# Patient Record
Sex: Female | Born: 1965 | Race: White | Hispanic: No
Health system: Midwestern US, Community
[De-identification: ages and names within clinical notes are randomized; demographics above are authoritative.]

## PROBLEM LIST (undated history)

## (undated) DIAGNOSIS — M79651 Pain in right thigh: Secondary | ICD-10-CM

---

## 2011-11-07 NOTE — ED Provider Notes (Signed)
MEDICATION ADMINISTRATION SUMMARY              Drug Name: *Phenergan, Dose Ordered: 25 mg, Route: IM, Status: Given,         Time: 00:11 11/08/2011,          Drug Name: *Morphine Sulfate, Dose Ordered: 8 mg, Route: IM, Status:         Given, Time: 00:10 11/08/2011,          Drug Name: *Valium, Dose Ordered: 5 mg, Route: Oral, Status: Given,         Time: 00:05 11/08/2011, *Additional information available in notes,         Detailed record available in Medication Service section.       KNOWN ALLERGIES   NKDA       TRIAGE (Mon Nov 07, 2011 23:31 [facility])   PATIENT: NAME: Michelle Herring, AGE: 45, GENDER: female,         DOB: Sat Dec 24, 1965, TIME OF GREET: Mon Nov 07, 2011 23:25, [place]:         [id number], KG WEIGHT: 88.0, MEDICAL RECORD NUMBER: [id number], ACCOUNT         NUMBER: [id number], PCP: Sedonia Small,. [facility] Nov 07, 2011 23:31         [facility])   ADMISSION: URGENCY: 4, TRANSPORT: Ambulatory, DEPT: Emergency,         BED: Rennis Harding 09. (Mon Nov 07, 2011 23:31 DLC3)   VITAL SIGNS: Pulse 83, Resp 15, Temp 97.9, Pain 7, O2 Sat 100, on         Room air, Time 11/07/2011 23:27. (23:27 DLC3)   COMPLAINT:  Fell Back Injury. (Mon Nov 07, 2011 23:31 DLC3)   PRESENTING COMPLAINT:  Pt state slipping and falling 2 days ago         after getting out of the bath tub, and fell on buttock. no radiating         pain. (Tue Nov 08, 2011 00:50 BMG1)   PAIN: Patient complains of pain, Pain described as aching, On a         scale 0-10 patient rates pain as 8, Right lower back, Pain is         constant. (Tue Nov 08, 2011 00:50 BMG1)   TREATMENT PRIOR TO ARRIVAL: None. (Tue Nov 08, 2011 00:50         BMG1)   TB SCREENING: TB screen negative for this patient. (Tue Nov 08, 2011 00:50 BMG1)   ABUSE SCREENING: Patient denies physical abuse or threats. (Tue         Nov 08, 2011 00:50 BMG1)   FALL RISK: Patient has a low risk of falling. (Tue Nov 08, 2011         00:50 BMG1)   SUICIDAL IDEATION: Suicidal ideation is not present. (Tue Nov 08, 2011 00:50 BMG1)   ADVANCE DIRECTIVES: Patient does not have advance directives.         (Tue Nov 08, 2011 00:50 BMG1)   PROVIDERS: TRIAGE NURSE: Durene Fruits, RN,BSN. Sheral Flow Nov 07, 2011 23:31 [facility])       PRESENTING PROBLEM Sheral Flow Nov 07, 2011 23:31 [facility])      Presenting problems: Back Injury-Pain-Swelling.       CURRENT MEDICATIONS   Diovan HCT:  150/25 mg Oral once a day. (23:32 DLC3)   Humalog:  insulin pump. (23:33 DLC3)   Nuvigil (23:33 DLC3)       MEDICATION SERVICE   Morphine Sulfate:  Order: Morphine Sulfate - Dose: 8 mg         : IM         Notes: do not give unless ride is present         Ordered by: Henderson Owatonna, PA-C         Entered by: Henderson Bangor, PA-C Mon Nov 07, 2011 23:45 ,          Acknowledged by: Quentin Mulling, RN Mon Nov 07, 2011 23:49         Documented as given by: Quentin Mulling, RN Tue Nov 08, 2011 00:10          Patient, Medication, Dose, Route and Time verified prior to         administration.         IM medication, Amount given: 8 mg, Medication administered to left         buttock, Correct patient, time, route, dose and medication confirmed         prior to administration, Patient advised of actions and side-effects         prior to administration, Allergies confirmed and medications reviewed         prior to administration.   Phenergan:  Order: Phenergan (Promethazine Hydrochloride) -         Dose: 25 mg : IM         Notes: For Infusion change route to IV Med infusion         For IV Dose: Dilute in 50ml NS.         do not give unless ride is present         Ordered by: Henderson [place], PA-C         Entered by: Henderson Salem, PA-C Mon Nov 07, 2011 23:45 ,          Acknowledged by: Quentin Mulling, RN Mon Nov 07, 2011 23:49         Documented as given by: Quentin Mulling, RN Tue Nov 08, 2011 00:11          Patient, Medication, Dose, Route and Time verified prior to         administration.         IM medication, Amount given: 25 mg, Medication administered to left          buttock, Medication combined for administration with morphine,         Correct patient, time, route, dose and medication confirmed prior to         administration, Patient advised of actions and side-effects prior to         administration, Allergies confirmed and medications reviewed prior to         administration, Patient in position of comfort, Side rails up, Cart         in lowest position, Family at bedside.   Valium:  Order: Valium (Diazepam) - Dose: 5 mg : Oral         Notes: do not give unless ride is present         Ordered by: Henderson Buxton, PA-C         Entered by: Henderson Pope, PA-C Mon Nov 07, 2011 23:46 ,          Acknowledged by: Quentin Mulling, RN Mon Nov 07, 2011 23:49  Documented as given by: Quentin Mulling, RN Tue Nov 08, 2011 00:05          Patient, Medication, Dose, Route and Time verified prior to         administration.          Amount given: 5 mg, Site: Medication administered P.O., Correct         patient, time, route, dose and medication confirmed prior to         administration, Patient advised of actions and side-effects prior to         administration, Allergies confirmed and medications reviewed prior to         administration, Patient in position of comfort, Side rails up, Cart         in lowest position, Family at bedside.       ORDERS (23:44 NJB)   Urine dip (send for lab U/A if positive):  Ordered for: Cipriano Mile,         MD, Onalee Hua         Status: Done by Jamesetta Geralds, Karl Ito Nov 07, 2011 23:57.       NURSING ASSESSMENT: BACK (Tue Nov 08, 2011 00:53 BMG1)   CONSTITUTIONAL: Patient arrives ambulatory, Gait steady, Patient         appears, in distress due to pain, Patient cooperative, Patient alert,         Oriented to person, place and time, Skin warm, Skin dry, Skin normal         in color, Mucous membranes pink, Mucous membranes moist, Patient is         well-groomed.   PAIN: aching pain, to the lower back, Right lower, constant, on a          scale 0-10 patient rates pain as 7.   BACK: Back assessment findings include tenderness to, the right         lower back, no paresthesias to extremities, no weakness to         extremities, no incontinence of bowel or bladder.   SAFETY: Side rails up, Cart/Stretcher in lowest position, Family         at bedside, Call light within reach, Hospital ID band on.       NURSING PROCEDURE: DISCHARGE NOTE (Tue Nov 08, 2011 01:07 BMG1)   DISCHARGE: Patient discharged to home, ambulating without         assistance, driving self, accompanied by husband/wife/partner,         Discharge instructions given to patient, Simple or moderate discharge         teaching performed, Prescriptions given and instructions on side         effects given, Name of prescription(s) given: tylenol, Above         person(s) verbalized understanding of discharge instructions and         follow-up care, Patient treated and evaluated by physician.       DIAGNOSIS (Tue Nov 08, 2011 00:26 NJB)   FINAL: PRIMARY: Acute lumbosacral strain/spasm.       DISPOSITION   PATIENT:  Disposition Type: Discharged, Disposition: Discharged,         Disposition Transport: Family/Friend drive, Condition: Stable. (Tue         Nov 08, 2011 00:26 NJB)      Patient left the department. (Tue Nov 08, 2011 01:10 BMG1)       VITAL SIGNS   VITAL SIGNS: Pulse: 83, Resp: 15, Temp: 97.9, Pain:  7, O2 sat:         100 on Room air, Time: 11/07/2011 23:27. (23:27 DLC3)     Pulse: 90, Resp: 16, Temp: 99.0, O2 sat: 98 on Room air, Time: 11/08/2011         01:07. (Tue Nov 08, 2011 01:07 TNC1)       INSTRUCTION (Tue Nov 08, 2011 00:26 NJB)   DISCHARGE:  LUMBOSACRAL STRAIN - WITHOUT X-RAYS (BACK STRAIN, LOW         BACK PAIN), MUSCLE SPASMS-CGH.   Clarisa Fling, MEDICINE, [address] Alric Quan         [place] [address], [phone], Frann Rider,         [address], [place] [address], [phone].    SPECIAL:  Do not drive or operate machinery tonight.         Vicodin and Robaxin for pain relief and muscle relaxant properties,         respectively. Do not: drive, drink alcohol, operate machinery while         on these medications. No Tylenol with Vicodin.         Naprosyn for pain relief/anti-inflammatory properties - do not take         with other NSAIDS.         If still experiencing pain after one week follow up with orthopedist.         Read and understand discharge instructions prior to leaving ER.         Follow these in regards to care and return for those reasons as         detailed.         Return to the ER if condition worsens or new symptoms develop.       PRESCRIPTION (Tue Nov 08, 2011 00:24 NJB)   Naprosyn:  Tablet : 500 Mg : Oral : Quantity: *** 1 *** Unit: tab         Route: Oral Schedule: 2 times a day Dispense: *** 10 ***.   NOTES:  No refills          May use generic.   Robaxin-750:  Tablet : 750 mg : Oral : Quantity: *** 1 *** Unit:         tab Route: Oral Schedule: As needed every four hours Dispense: *** 12         ***.   NOTES   Vicodin:  Tablet : 500 mg-5 mg : Oral : Quantity: *** 1-2 ***         Unit: tab Route: Oral Schedule: As needed every 6 hours Dispense: ***         12 ***.   NOTES   Key:     BMG1=Gray, RN, Judie Grieve  DLC3=Catalano, RN,BSN, Lupita Leash  NJB=Brosky, PA-C,     Weston Brass     TNC1=Castaneda, ACT III, Christain Sacramento
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
## 2011-12-09 LAB — RUBELLA AB, IGG: Rubella IgG, QL: IMMUNE

## 2011-12-11 LAB — VZV AB, IGG: V. zoster, IgG: 2.56 index (ref >1.09)

## 2011-12-11 LAB — MUMPS AB, IGG: Mumps Abs, IgG: 1.24 index — ABNORMAL HIGH (ref 0.00–0.90)

## 2011-12-11 LAB — RUBEOLA AB, IGG: Rubeola Ab, IgG: 1.55 index — ABNORMAL HIGH (ref 0.00–0.90)

## 2011-12-12 LAB — HEP B SURFACE AB
Hep B surface Ab Interp.: NEGATIVE — AB
Hepatitis B surface Ab: <0.1 Index — ABNORMAL LOW (ref >1.00)

## 2011-12-12 LAB — HEPATITIS B SURFACE ANTIBODY
Hep B S Ab Interp: NEGATIVE — AB
Hep B S Ab: <0.1 Index — ABNORMAL LOW (ref >1.00)

## 2012-11-02 NOTE — Progress Notes (Signed)
[name] is a 46 y.o. Caucasian lady referred for an evaluation of chest pain.  She reports having had discomfort about 10 days ago on three consecutive days. She had the sensation of chest pressure in the left precordial area that occurred while she was at rest, doing nothing at that time and was not short of breath at that time.  The following day, she had left shoulder similar pressure sensation and the following day she had pressure again. She took aspirin and the discomfort resolved in a couple of hours. She is unsure exactly if the aspirin had anything to do with it or not. She cannot think of anything in particular that was out of the ordinary for those particular days and no specific trigger for the symptoms. She acknowledges that she is under a lot more stress, that she is contemplating moving back to [place] after spending the last year and a half here in [place] helping her daughter care for her child while her husband was deployed.  She used to be active from a physical standpoint, working out three times a week on a stationary bicycle for about an hour, but she has not done that since she moved up to [place]. She has gained more weight during this time span and has noticed that her insulin requirements have increased as well.  She denies any prior cardiac history but has been on antihypertensive agents for several years. She reports having had preeclampsia with her pregnancies and needed induced delivery.  She does not smoke but has had significant exposure to second hand smoking as a child. She has dyslipidemia and apparently did take Simvastatin several years ago but because she was not taking it on a regular basis, apparently this was not prescribed any further. She has been using a CPAP for sleep apnea for a couple of years and she has noted significant improvement with its use as far as energy is concerned.  She thinks she could do whatever she would like to do from a physical standpoint,  though as mentioned above she has not established a regular exercise program.      She denies any fever or chills. No skin rashes or hearing problems. No headache or sinus congestion. She has occasional blurred vision.  She does have occasional palpitations which have not changed in frequency or intensity for the last year or so.  They occur probably once or twice a week, less than a minute at a time, and not associated with any other symptoms. She denies orthopnea or PND. Has occasional lower extremity swelling which is intermittent, resolving after nighttime rest. If she pushes herself a lot, she will probably be winded but she denies cough, sputum production or wheezing.  Denies any GI symptoms. She has occasional urinary urgency but no dysuria or discomfort. She has lower back discomfort once in a while. No bleeding problems, no neurologic symptoms.    In family history, it is unclear if she had premature coronary disease. Her father had hypertension and apparently diabetes and died from cancer, but she does not know if he had some issues in his 36s.  He was a heavy smoker.  No history of sudden cardiac death.      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 Diabetes Mother    ??? Cancer Father    ??? Diabetes Father    ??? Hypertension Father    ??? Stroke Paternal Grandmother        History     Social History   ??? Marital Status: DIVORCED     Spouse Name: N/A     Number of Children: N/A   ??? Years of Education: N/A     Occupational History   ??? Not on file.     Social History Main Topics   ??? Smoking status: Never Smoker    ??? Smokeless tobacco: Never Used   ??? Alcohol Use: Yes      Comment: occasionally   ??? Drug Use:  No   ??? Sexually Active: Yes -- Female partner(s)     Other Topics Concern   ??? Not on file     Social History Narrative   ??? No narrative on file       No Known Allergies    Current Outpatient Prescriptions   Medication Sig Dispense Refill   ??? valsartan-hydrochlorothiazide (DIOVAN-HCT) 160-25 mg per tablet Take 1 Tab by mouth daily.       ??? insulin lispro (HUMALOG) 100 unit/mL injection 100 Units by SubCUTAneous route.       ??? ALIP ACID/BIOTIN/MIN 16/HRB91 (BIOTIN-ALPHA LIPOIC A-MIN-HB91 PO) Take  by mouth.       ??? multivitamin (ONE A DAY) tablet Take 1 Tab by mouth daily.         No current facility-administered medications for this visit.       BP 147/85   Pulse 82   Ht 5\' 3"  (1.6 m)   Wt 92.987 kg (205 lb)   BMI 36.32 kg/m2   SpO2 98%    Physical exam:  blood pressure reported above was on my check in the right arm.    HEENT:  No scleral icterus.  Normal oropharyngeal mucosa.  Mallampati class 4.    VESSELS:  Jugular venous pressure was normal.   No obvious carotid bruits.  2/4 radial pulses bilaterally and 1/4 dorsalis pedis pulses bilaterally.    CARDIAC:  Regular rhythm. Normal S1, S2.  No obvious gallop.  No murmur.   LUNGS:  Clear.  ABDOMEN:  Slightly enlarged.  No hepatosplenomegaly.  No tenderness to palpation, rebound or guarding.  EXTREMITIES:  No clubbing or cyanosis.  Trace pedal edema.    Studies:  Electrocardiogram showed sinus rhythm, rate of 82.  Normal EKG.    Impression/Plan:  1. Chest pain.  This discomfort appears atypical for myocardial ischemia. It did not occur on exertion or psychological stress. It was not associated with any other symptoms and resolved on its own.  Nonetheless, she does have several atherosclerotic risk factors and it would not be unreasonable to proceed with an exercise stress test.  As she is leaving this area in the next 10 days, she elected not to have it scheduled at this time.  2. Hypertension. This is not adequately controlled. It appears that she is using more dietary  salt than ideal, specifically eating a lot of canned soup. I asked her  to try to change that. She is planning on resuming more activities when she moves back to [place] which will hopefully  Help with some weight loss and maybe improvement in her blood pressure control. Nonetheless, I did urge her reestablish contact with her prior physicians in [place] within the next several [place] to have follow up on this issue.   3. Dyslipidemia.  I do not have a copy of her lipid profile. Obviously, it was deemed to be significant enough to provide a statin in the past.  I think her atherosclerotic risks should be assessed again, and depending on her lipid level, consideration of statin could be given. I did outline the fact that she would need to be taking this for the rest of her life if she is deemed to be at high enough risk at this time.  If a more individual approach to her risk assessment will be desired, coronary calcium score testing would not be unreasonable.  4. Obesity.  Outlined the importance of weight loss. She has committed herself to reestablish an exercise program and pay close attention to her diet.  5. At this time, I will see the patient back on an as needed basis.  I told her that if she changes her mind until she leaves this area, she can let us know and we will try to schedule a stress test. Otherwise, she will have it done when she returns to [place].      MedDATA/kse

## 2015-04-05 NOTE — Progress Notes (Signed)
Formatting of this note is different from the original.  Subjective:      Patient ID: Michelle Herring is a 49 y.o. female.    HPI     One Time Medication Renewal   The patient  has seen a prescribing clinician in the last 12 months.  This is  not a subsequent refill from [facility].        Comments   Pt. Brought prescription vials with her. Last script date is 08/21/2014. Pt. Relocated to area and does not have PCP yet.          ROS     Positive for: Endocrine    Negative for: Constitutional         History   Smoking status   ? Never Smoker    Smokeless tobacco   ? Not on file     Past Medical History   Diagnosis Date   ? Depression    ? Anxiety    ? Hypertension    ? Diabetes mellitus      Past Surgical History   Procedure Laterality Date   ? Hysterectomy     ? Cesarean section     ? Appendectomy       No family history on file.    Objective:     Physical Exam   Constitutional: She is oriented to person, place, and time. She appears well-developed and well-nourished.   Cardiovascular: Normal rate and regular rhythm.    Pulmonary/Chest: Effort normal and breath sounds normal.   Neurological: She is alert and oriented to person, place, and time.   Skin: Skin is warm and dry.   Psychiatric: She has a normal mood and affect. Her behavior is normal.       Assessment/Plan: DM, HTN, Depression, Anxiety, One time Medication Refill        Patient is stable on current medications with no impact to plan of care today. Educated pt. On need to obtain PCP locally and establish care w/i next 30 days. Take meds as directed. No physical c/o today. Pt. Refused A1C check today, states she is not sure if her insurance will cover it. Start 2000 cal ADA with DASH die (avoid processed carbs and concentrated sweets). Start walking program. Stressed the importance of "not running out of meds" and seeing PCP every 3 mons until DM is controlled. Pt. Verbalized understanding and agrees to f/u with PCP ASAP for quarterly labs.          Electronically signed by Thalia PartySamantha Sixieme, NP at 04/06/2015  1:19 PM EDT

## 2017-02-06 NOTE — Progress Notes (Signed)
Formatting of this note might be different from the original.  Rapid UDS collected by myself and Woodward Ku without incident. Results scanned to DER  Electronically signed by Jerre Simon, RT at 02/08/2017  4:12 PM EDT

## 2020-09-27 NOTE — ED Provider Notes (Signed)
Formatting of this note is different from the original.  Images from the original note were not included.    [facility] Emergency Department Provider Note    Patient Identification  Michelle Herring  Patient information was obtained from patient and past medical records.    Medical Decision Making, Impression, Plan     54 y.o. female presenting to the emergency department with pain and redness of the left lower extremity in the setting of a recent great toe amputation and lymph node biopsy as described below. Physical exam is notable for a well-appearing woman in no distress.  She has minimal redness over the distal aspect of the left foot surrounding the surgical site.  She has mild tenderness with trace edema over the calf below the knee, no increased redness or warmth.  She has a firm and nontender enlarged lymph node in the left inguinal crease without tenderness, increased redness, or discharge.     BP (!) 151/110 (BP Position: Sitting)   Pulse 64   Temp 98 F (36.7 C) (Oral)   Resp 16   Ht 1.6 m ('5\' 3"' )   Wt 86.2 kg (190 lb)   SpO2 98%   BMI 33.66 kg/m     Assessment: Patient does have evidence of mild soft tissue infection at the distal aspect of the foot near her surgical site.  This is slightly red but by patient report is improving in the setting of recently starting Bactrim.  At this time I do not believe that she has any infection that is tracking up her leg as there is no evidence of cellulitis or increased redness.  Her swelling and pain may be secondary to a DVT given her recent reduction in mobilization.  Also would consider poor lymphatic drainage given recent lymph node biopsy.  Her lymph node does not appear to be acutely infected and I believe that the swelling is likely reactive in the setting of her recent biopsy.    Will obtain basic labs, venous duplex to evaluate for DVT. Will treat patient with pain control and antibiotics as indicated    ED Course:   ED Course as of  09/27/20 1103   Sun Sep 27, 2020   0923 Patient's work-up is unremarkable.  Labs without clinically significant abnormalities.  Ultrasound without evidence of DVT.  The heterogeneous collection that is noted on the ultrasound is consistent with a lymph node that was biopsied.  Will add Keflex for further coverage of soft tissue infection and have patient follow-up with her primary.  The patient has been updated of testing results and my plan.  There was shared decision making throughout the course of this case with the patient and family at bedside if available.  Patient is in agreement and understanding of my plan and disposition for them at this time.  Patient will be discharged in stable condition and has been instructed both verbally and in writing to come back to the emergency department for any concerns that they have, or if their symptoms worsen in the next 8 to 12 hours.  They also agree to see their primary care provider in the next few days regarding follow-up.     Impression: Cellulitis of left lower extremity  (primary encounter diagnosis)  Pain of left lower extremity    Disposition: Discharge    Additional Medical Decision Making     I independently visualized the EKG tracing if available.   I independently visualized the radiology images if available.  I reviewed the patient's prior medical records if available.   I discussed the case with the appropriate consultant or admitting team as needed  I have reviewed the triage vital signs and the nursing notes. I have discussed case with ED attending.    Any labs and radiology results that were available during my care of the patient were independently reviewed by me and considered in my medical decision making.    Portions of this record have been created using [employer]. Dictation errors have been sought, but may not have been identified and corrected.  ____________________________________________    Patient History     Chief  Complaint  Chief Complaint in Triage   Patient presents with   ? Multiple Medical complaints     Patient had left great toe amputated on 11/02 and lymphnode biopsy of the left groin. Pt c/o tenderness to LLE. Pt has ongonig infection of surgical site of left great toe - being treated by surgeon @ [facility]. Redness/swelling to left foot.      HPI   Michelle Herring is a 54 y.o. female with a past medical history of anxiety, DM, HTN, malignant melanoma of the great toe and lymph node biopsy on 11/2 presenting with left lower extremity pain.  On 11/12 patient was started on Bactrim after a phone call to her surgical team with reports of increased redness at the surgical site of the foot.  She reports that the redness has improved since that time.  Several days ago she also had a sudden expression of fluid from the toe surgical site, but has not had any other drainage or discharge.  She has not had fevers.  She is concerned that her infection might be spreading up her leg as she has had pain and tenderness over the left calf and leg diffusely, as well as some radiating pain up to the thigh intermittently.  She has not noted any redness.  Notes that the leg is mildly swollen.  This pain is worse when she stands up or walks.  At the moment while she is lying down it is approximately a 3/10.  She also has a significant amount of swelling in the left inguinal crease at the site of her lymph node biopsy.  She has not noted significant tenderness or redness at the site.  No discharge from the surgical incision.  She has never had a DVT before, not on blood thinners.    Review of Systems  Per HPI and as follows.  Constitutional: no fever   Cardiovascular:  no chest pain  Respiratory: no hemoptysis, shortness of breath  GI: no vomiting or diarrhea  GU: no dysuria or increased frequency  Integumentary: + Redness of surgical site  Allergy: no hives  Musculoskeletal: + Unilateral swelling and calf pain  Neurological: no  paresthesias    Past Medical History:   Diagnosis Date   ? Anxiety    ? Diabetes mellitus (CMS/HCC)    ? Hypertension    ? Malignant melanoma (CMS/HCC)      There is no problem list on file for this patient.    Past Surgical History:   Procedure Laterality Date   ? HYSTERECTOMY     ? lymphnode biopsy     ? TOE AMPUTATION       Discharge Medication List as of 09/27/2020 10:43 AM     START taking these medications    Details   cephalexin (KEFLEX) 500 MG capsule Take 1 capsule (  500 mg total) by mouth 4 (four) times a day for 5 days., Starting Sun 09/27/2020, Until Fri 10/02/2020, Normal       CONTINUE these medications which have NOT CHANGED    Details   escitalopram oxalate (LEXAPRO) 20 MG tablet Take 20 mg by mouth daily., Historical Med     insulin aspart U-100 (NOVOLOG FLEXPEN U-100 INSULIN) 100 unit/mL (3 mL) InPn injection pen Inject under the skin., Historical Med     valsartan-hydrochlorothiazide (DIOVAN-HCT) 160-12.5 mg per tablet Take 1 tablet by mouth daily., Historical Med         Allergies  Patient has no known allergies.    History reviewed. No pertinent family history.    Social History  Social History     Tobacco Use   ? Smoking status: Never Smoker   ? Smokeless tobacco: Never Used   Substance Use Topics   ? Alcohol use: Yes     Comment: socially   ? Drug use: Never     Physical Exam     Vitals:    09/27/20 0827 09/27/20 0829 09/27/20 1047   BP: 165/72  (!) 151/110   BP Location: Right upper arm     BP Position: Sitting  Sitting   Cuff Size: Adult (Navy Blue)     Pulse: 73  64   Resp: 14  16   Temp:  98.2 F (36.8 C) 98 F (36.7 C)   TempSrc: Oral Oral Oral   SpO2: 98%     Weight: 86.2 kg (190 lb)     Height: 1.6 m ('5\' 3"' )       Reviewed vital signs and nursing note as charted by RN.    Adult Medical Physical Exam  CONSTITUTIONAL: Alert and oriented and responds appropriately to questions. Well-appearing; well-nourished in no acute distress  HEAD: Normocephalic; atraumatic  EYES: Conjunctivae clear,  sclerae non-icteric  ENT: Moist mucous membranes  NECK: Supple without meningismus  CARD: Skin is well perfused, no JVD.  2+ DP pulses bilaterally  RESP: Normal chest excursion without splinting or tachypnea.  Lungs CTA B  ABD/GI:  Non-distended, soft, nontender  EXT: Trace edema to the left lower extremity which is greater than the right, mild tenderness palpation over the calf and anterior shin.  No redness or warmth of the leg proximal to the distal foot wound.  No crepitance.  This wound appears CDI with mild surrounding erythema.  Lymph node in the inguinal crease is swollen and firm, but has no overlying fluctuance, redness, or tenderness.  Incision site is CDI.  SKIN: Normal color for age and race; warm; dry  NEURO: Alert, oriented, Moves all extremities  PSYCH: The patient's mood and manner are appropriate. Grooming and personal hygiene are appropriate    Results     I personally reviewed the results in the chart as listed below    Results for orders placed or performed during the hospital encounter of 09/27/20   Urinalysis with Reflex Urine Culture   Result Value Ref Range    Color YELLOW Lt Yellow or Yellow    Urine Clarity CLEAR Clear    Urine Specific Gravity 1.027 1.003 - 1.035    Urine pH 5.0 5.0 - 8.0    Urine Albumin NEGATIVE Negative    Urine Glucose Screen NEGATIVE Negative    Urine Ketones NEGATIVE Negative    Urine Bilirubin NEGATIVE Negative    Urine Hemoglobin NEGATIVE Negative    Urine Leukocyte Esterase NEGATIVE Negative  Urine Nitrite NEGATIVE Negative    Urine Urobilinogen 2 (A) 0.2 - 1.0 mg/dL    Urine Bacteria TRACE Trace /HPF    Urine RBC <3 <3 /HPF    Urine WBC <5 <5 /HPF   BMP   Result Value Ref Range    Sodium 138 136 - 145 mmol/L    Potassium 4.4 3.5 - 5.1 mmol/L    Chloride 105 99 - 108 mmol/L    CO2 27 21 - 31 mmol/L    BUN 16 7 - 25 mg/dL    Creatinine 0.96 0.51 - 1.00 mg/dL    Glucose, Random 189 70 - 199 mg/dL    Calcium, Total 9.5 8.8 - 10.6 mg/dL    Osmolality (calculated)  282 270 - 295 mOsm/kg    Anion Gap 6 3 - 11   CBC with Differential   Result Value Ref Range    Differential Percent Diff %     Differential Absolute Diff Absolute     WBC 7.7 3.6 - 11.2 K/uL    RBC 4.38 3.63 - 4.92 M/uL    Hemoglobin 12.7 11.4 - 15.0 g/dL    Hematocrit 37 31 - 42 %    Mean Cell Volume 85 74 - 96 fL    Mean Cell Hemoglobin 29 26 - 33 pg    Mean Cell Hemoglobin Concentration 34 33 - 36 g/dL    RDW 12.9 12.3 - 17.0 %    Platelet Count 267 150 - 450 K/uL    Mean Platelet Volume 7.5 7.5 - 11.2 fL    Neutrophils 65 37 - 80 %    Lymphocytes 27 10 - 50 %    Monocytes 6 0 - 12 %    Eosinophils 1 0 - 7 %    Basophils 1 0 - 2 %    Neutrophils Absolute 5.0 1.3 - 9.0 K/uL    Lymphocytes Absolute 2.1 0.4 - 5.6 K/uL    Monocytes Absolute 0.5 0.0 - 1.3 K/uL    Eosinophils Absolute 0.1 0.0 - 0.8 K/uL    Basophils Absolute 0.0 0.0 - 0.2 K/uL    Nucleated RBCS 0 /100 WBC   EGFR (MDRD)   Result Value Ref Range    EGFR >60 mL/min/1.22m   Urinalysis, Point of Care   Result Value Ref Range    Urine Color, Point of Care Yellow Lt Yellow or Yellow    Urine Clarity, Point of Care Clear Clear    Urine Glucose, Point of Care Trace (A) Negative    Urine Bilirubin, Point of Care Negative Negative    Urine Ketones, Point of Care Negative Negative    Urine Specific Gravity, Point of Care >=1.030 (A) 1.003 - 1.035    Urine Blood, Point of Care Negative Negative    Urine pH, Point of Care 6.0 5.0 - 8.0    Urine Protein, Point of Care Negative Negative    Urine Urobilinogen, Point of Care 0.2 0.2 - 1.0    Urine Nitrite, Point of Care Negative Negative    Urine Leukocyte Esterase, Point of Care Negative Negative   Pregnancy, Urine, Point of Care   Result Value Ref Range    Pregnancy, Urine, Point of Care Negative      Radiology     [place]VENOUS DOPPLER LOWER LEFT STAT   Final Result   IMPRESSION:   1. No evidence of acute deep venous thrombosis.   2. Large complex oblong 4.7 cm hypoechoic collection in the  left groin/inguinal region,  demonstrating low level internal echoes and numerous thin internal septations, as noted above. The findings are nonspecific, possibly related to an evolving soft tissue hematoma, although abscess/infection cannot be excluded on this examination. Clinical correlation is recommended.         Asencion Noble, MD  EM PGY 3      Asencion Noble, MD  09/27/20 1104    Electronically signed by Judson Roch, DO at 09/28/2020  1:38 PM EST

## 2020-09-27 NOTE — ED Notes (Signed)
Formatting of this note might be different from the original.  SUPERVISING PHYSICIAN NOTE: I have personally evaluated this patient and discussed the patient's evaluation and management as documented by the Resident. I reviewed the EKG(s) and/or x-rays(s), if any, along with their interpretation and agree with their findings unless otherwise noted. My findings confirm, and/or supplement their note as follows:       Patient presents to the ED as above per resident note.  She has left lower extremity mild swelling and discomfort coming into the calf.  She had a great toe amputation on 11 2.  Has some redness and small amount of drainage, surgeon placed her on Bactrim.  She complains of discomfort in the calf of the leg.  There is no fevers.  No redness coming up the leg.  No drainage of purulent material.  Will check Doppler, consider adding Keflex to Bactrim.  Follow-up with her surgeon.  Electronically signed by Faylene Million, DO at 09/27/2020  9:46 AM EST

## 2021-08-18 ENCOUNTER — Encounter

## 2021-08-19 ENCOUNTER — Inpatient Hospital Stay: Admit: 2021-08-19 | Payer: BLUE CROSS/BLUE SHIELD | Attending: Family Medicine

## 2021-08-19 DIAGNOSIS — M79651 Pain in right thigh: Secondary | ICD-10-CM

## 2021-09-29 DIAGNOSIS — E1065 Type 1 diabetes mellitus with hyperglycemia: Secondary | ICD-10-CM

## 2021-09-29 NOTE — ED Notes (Signed)
Pt states she has been having difficulty controlling her blood glucose for the last 3 days.  Glu 419 in triage

## 2021-09-29 NOTE — ED Provider Notes (Signed)
ED Provider Notes by Fuller Song, PA-C at 09/29/21 2130                Author: Fuller Song, PA-C  Service: EMERGENCY  Author Type: Physician Assistant       Filed: 09/30/21 0032  Date of Service: 09/29/21 2130  Status: Attested           Editor: Sanjuan Dame (Physician Assistant)  Cosigner: Hortense Ramal, MD at 09/30/21 [phone]          Attestation signed by Hortense Ramal, MD at 09/30/21 9604          I, Truddie Crumble MD, have discussed the history, physical exam findings, relevant laboratory/radiology findings and plan with the midlevel provider.  I have been  available at all times and agree with the management and disposition documented.      Truddie Crumble, MD   September 30, 2021                                  [place]   Emergency Department Treatment Report          Patient: Michelle Herring  Age: 55 y.o.  Sex: female          Date of Birth: 1966-04-03  Admit Date: 09/29/2021  PCP: [facility]     MRN: [id number]   CSN: [id number]   Attn:Helen Rigoberto Noel, MD         Room: ER28/ER28  Time Dictated: 9:30 PM  APP: Karle Starch. Robby Sermon, PA-C           Chief Complaint    Elevated blood sugar     History of Present Illness     55 y.o. female history of insulin requiring diabetes mellitus presents with 3 days of her blood sugar in the "high "range she states she cannot get it down she has an insulin pump  dysfunction she changed out yesterday.  [facility] she was diagnosed with a UTI.  She was put on a 3-day course of Bactrim.  She knows she had a UTI back in October for which she finished a 10-day course of antibiotics and  feels like it improved.  She states she was having some urgency frequency some pressure but no frank dysuria.  She has no chest pain no shortness of breath no vomiting no fever or chills.  She states her last hemoglobin A1c was in the 9 range.  She did  give her several bolus of insulin around 1730.        Review of Systems     Review of  Systems    Constitutional:  Negative for chills and fever.    Respiratory:  Negative for cough and shortness of breath.     Cardiovascular:  Negative for chest pain.    Gastrointestinal:  Negative for nausea and vomiting.    Genitourinary:  Positive for frequency and urgency .    Musculoskeletal:  Negative for myalgias.    Neurological:  Negative for sensory change, focal weakness and headaches.    All other systems reviewed and are negative.        Past Medical/Surgical History     Records reviewed and interpreted by Theodore Demark PA-C patient's outpatient primary care record reviewed indicating history of uncontrolled type 1 diabetes     Past Medical History:  Diagnosis  Date         ?  Diabetes mellitus, type II, insulin dependent ([place])       ?  Dyslipidemia       ?  HTN (hypertension)       ?  Obesity           ?  OSA on CPAP            Past Surgical History:         Procedure  Laterality  Date          ?  HX APPENDECTOMY    1984     ?  HX CESAREAN SECTION    1984; 1986; 1989          ?  HX HYSTERECTOMY    1998     Cholecystectomy gastric sleeve        Social History          Social History          Socioeconomic History         ?  Marital status:  LEGALLY SEPARATED       Tobacco Use         ?  Smoking status:  Never     ?  Smokeless tobacco:  Never       Substance and Sexual Activity         ?  Alcohol use:  Yes             Comment: occasionally         ?  Drug use:  No     ?  Sexual activity:  Yes              Partners:  Male     No tobacco no alcohol        Family History          Family History         Problem  Relation  Age of Onset          ?  Diabetes  Mother       ?  Cancer  Father       ?  Diabetes  Father       ?  Hypertension  Father            ?  Stroke  Paternal Grandmother               Current Medications     Insulin     Current Outpatient Medications          Medication  Sig  Dispense  Refill           ?  valsartan-hydrochlorothiazide (DIOVAN-HCT) 160-25 mg per tablet  Take 1 Tab by mouth  daily.         ?  insulin lispro (HUMALOG) 100 unit/mL injection  100 Units by SubCUTAneous route.         ?  ALIP ACID/BIOTIN/MIN 16/HRB91 (BIOTIN-ALPHA LIPOIC A-MIN-HB91 PO)  Take  by mouth.               ?  multivitamin (ONE A DAY) tablet  Take 1 Tab by mouth daily.                 Allergies     No Known Allergies        Physical Exam     Visit Vitals      BP  138/76  Pulse  79     Temp  97 ??F (36.1 ??C)     Resp  14     Ht  '5\' 3"'  (1.6 m)     Wt  88 kg (194 lb)     SpO2  95%        BMI  34.37 kg/m??        Physical Exam   Vitals reviewed.    Constitutional:        Comments: Very pleasant     Eyes:       General: No scleral icterus.      Conjunctiva/sclera: Conjunctivae normal.       Pupils: Pupils are equal, round, and reactive to light.     Cardiovascular:       Rate and Rhythm: Normal rate and regular rhythm.    Pulmonary:       Breath sounds: Normal breath sounds.     Abdominal:       General: Bowel sounds are normal. There is no distension.       Palpations: Abdomen is soft.       Tenderness: There is no abdominal tenderness.       Comments: No organomegaly     Musculoskeletal:       Cervical back: Neck supple. No tenderness.       Comments: Extremities warm dry well-perfused nontender no flank tenderness    Skin:      General: Skin is warm and dry.       Capillary Refill: Capillary refill takes less than 2 seconds.    Neurological:       General: No focal deficit present.       Mental Status: She is alert.    Psychiatric:          Mood and Affect: Mood normal.          Behavior: Behavior normal.            Impression and Management Plan     55 year old female with a history of diabetes mellitus and elevated blood sugars for the last 3 days.  Recently diagnosed with UTI.  May be contributing to her hyperglycemia.  This  is an acute exacerbation in the chronic condition.  This time we will start an IV will hydrate with IV fluids she does not have any history of cardiac history of CHF.  Baseline lab work to  check electrolytes check her glucose check her acid-base status  will check a urine at this time as well.  We will send a culture.  Having any respiratory symptoms or other foci of infection.      Ddx: Hyperglycemia, DKA, HHNK, hydration, acute kidney injury, UTI      records will be reviewed      Type(s) of Personal Protection Equipment (PPE) worn during exam:  N95 mask, eyeshield, gloves      Diagnostic Studies     Lab:      Recent Results (from the past 12 hour(s))     GLUCOSE, POC          Collection Time: 09/29/21  8:29 PM         Result  Value  Ref Range            Glucose (POC)  419 (HH)  65 - 105 mg/dL       POC URINE MACROSCOPIC          Collection Time: 09/29/21  8:51 PM  Result  Value  Ref Range            Glucose  >=1000 (A)  NEGATIVE,Negative mg/dl       Bilirubin  Negative  NEGATIVE,Negative         Ketone  Negative  NEGATIVE,Negative mg/dl       Specific gravity  1.015  1.005 - 1.030         Blood  Negative  NEGATIVE,Negative         pH (UA)  5.0  5 - 9         Protein  Negative  NEGATIVE,Negative mg/dl       Urobilinogen  0.2  0.0 - 1.0 EU/dl       Nitrites  Negative  NEGATIVE,Negative         Leukocyte Esterase  Trace (A)  NEGATIVE,Negative         Color  Yellow          Appearance  Clear          POC URINE MICROSCOPIC          Collection Time: 09/29/21  8:51 PM         Result  Value  Ref Range            WBC  1-4  /HPF       CBC WITH AUTOMATED DIFF          Collection Time: 09/29/21  9:06 PM         Result  Value  Ref Range            WBC  8.7  4.0 - 11.0 1000/mm3       RBC  4.48  3.60 - 5.20 M/uL       HGB  12.6 (L)  13.0 - 17.2 gm/dl       HCT  36.9 (L)  37.0 - 50.0 %       MCV  82.4  80.0 - 98.0 fL       MCH  28.1  25.4 - 34.6 pg       MCHC  34.1  30.0 - 36.0 gm/dl       PLATELET  256  140 - 450 1000/mm3       MPV  10.3 (H)  6.0 - 10.0 fL       RDW-SD  36.9  36.4 - 46.3         NRBC  0  0 - 0         IMMATURE GRANULOCYTES  0.3  0.0 - 3.0 %       NEUTROPHILS  61.2  34 - 64 %        LYMPHOCYTES  30.9  28 - 48 %       MONOCYTES  5.7  1 - 13 %       EOSINOPHILS  1.3  0 - 5 %       BASOPHILS  0.6  0 - 3 %       METABOLIC PANEL, COMPREHENSIVE          Collection Time: 09/29/21  9:06 PM         Result  Value  Ref Range            Potassium  4.0  3.5 - 5.1 mEq/L       Chloride  99  98 - 107 mEq/L       Sodium  134 (L)  136 - 145  mEq/L       CO2  25  20 - 31 mEq/L       Glucose  432 (HH)  74 - 106 mg/dl       BUN  16  9 - 23 mg/dl       Creatinine  1.22 (H)  0.55 - 1.02 mg/dl       GFR est AA  58.0          GFR est non-AA  48          Calcium  10.5 (H)  8.7 - 10.4 mg/dl       Anion gap  10  5 - 15 mmol/L       AST (SGOT)  19.0  0.0 - 33.9 U/L       ALT (SGPT)  22  10 - 49 U/L       Alk. phosphatase  175 (H)  46 - 116 U/L       Bilirubin, total  0.50  0.30 - 1.20 mg/dl       Protein, total  7.5  5.7 - 8.2 gm/dl       Albumin  2.4 (L)  3.4 - 5.0 gm/dl       POC CHEM8          Collection Time: 09/29/21  9:11 PM         Result  Value  Ref Range            Sodium  136  136 - 145 mEq/L       Potassium  4.1  3.5 - 4.9 mEq/L       Chloride  100  98 - 107 mEq/L       CO2, TOTAL  27  21 - 32 mmol/L       Glucose  437 (H)  74 - 106 mg/dL       BUN  19  7 - 25 mg/dl       Creatinine  1.0  0.6 - 1.3 mg/dl       HCT  40  38 - 45 %       HGB  13.6  12.4 - 17.2 gm/dl       CALCIUM,IONIZED  5.10  4.40 - 5.40 mg/dL       POC BLOOD GAS          Collection Time: 09/29/21  9:16 PM         Result  Value  Ref Range            pH  7.428 (H)  7.310 - 7.410         PCO2  41.4  41.0 - 51.0 mm Hg       PO2  46.0 (H)  35 - 40 mm Hg       BICARBONATE  27.4 (H)  22.0 - 26.0 mmol/L       O2 SAT  83.0 (H)  70 - 75 %       CO2, TOTAL  29  21 - 32 mmol/L       BASE EXCESS  3  -2 - 3 mmol/L       Sample type  VEN          GLUCOSE, POC          Collection Time: 09/29/21 11:53 PM         Result  Value  Ref Range  Glucose (POC)  76  65 - 105 mg/dL           Imaging:     No results found.   Procedures        Medical Decision  Making/ED Course     Patient's initial lab work shows a glucose of 419 CBC show white 8.7 H&H 12.6 and 36.9 no shift.  BMP showed normal electrolytes pH was 7.4 bicarb 27.4.  Patient not acidotic urine  does show 1-4 WBCs.  This is been sent for culture.  patient be placed in ED observation status for continued IV hydration.  She has her insulin pump on basal rate we will give 6 units of regular insulin and this was discussed with patient she is agreeable.   Her urine does show some infection still we will give her dose of Rocephin IV now.  And we will check her blood glucose after her fluid bolus.        Medications       sodium chloride (NS) flush 5-10 mL (10 mL IntraVENous Given 09/29/21 2037)     sodium chloride 0.9 % bolus infusion 1,000 mL (1,000 mL IntraVENous New Bag 09/29/21 2158)     cefTRIAXone (ROCEPHIN) 1 g in 0.9% sodium chloride (MBP/ADV) 50 mL MBP (0 g IntraVENous IV Completed 09/29/21 2234)       insulin regular (NOVOLIN R, HUMULIN R) injection 6 Units (6 Units IntraVENous Given 09/29/21 2204)               Final Diagnosis                 ICD-10-CM  ICD-9-CM          1.  Type 1 diabetes mellitus with hyperglycemia (HCC)   E10.65  250.01     2.  Dehydration   E86.0  276.51          3.  Acute cystitis without hematuria   N30.00  595.0             Disposition     ED observation status.  Patient examined and evaluated by myself and Dr. Truddie Crumble who agrees with above assessment and plan.         Leatrice Jewels, PA-C   September 30, 2021      My signature above authenticates this document and my orders, the final     diagnosis (es), discharge prescription (s), and instructions in the Epic     record.   If you have any questions please contact [phone].   Dragon medical dictation software was used for portions of this report. Unintended voice recognition errors may occur.

## 2021-09-29 NOTE — ED Notes (Signed)
Pt medicated per [facility] and updated pt on plan of care. Pt has no needs at this time. Call bell within reach.

## 2021-09-29 NOTE — ED Notes (Signed)
MD at bedside.

## 2021-09-29 NOTE — ED Notes (Signed)
PT wanted RN and MD to know that she checked her sugar and it is reading 73. MD and PA made aware    1/2 bag NS left until next BGL.

## 2021-09-29 NOTE — ED Notes (Signed)
Patient is alert.  Aware of plan of care.   Resting at this time.  No complaints.  Call light within reach.  Awaiting glucose results after NS bolus and disposition.

## 2021-09-29 NOTE — ED Notes (Signed)
Pt ambulatory to bathroom. Pt is wearing her insulin pump left arm.

## 2021-09-30 ENCOUNTER — Inpatient Hospital Stay
Admit: 2021-09-30 | Discharge: 2021-09-30 | Disposition: A | Discharge status: Home / Self Care | Payer: BLUE CROSS/BLUE SHIELD | Attending: Emergency Medicine

## 2021-09-30 LAB — POC CHEM8
BUN: 19 mg/dl (ref 7–25)
BUN: 19 mg/dl (ref 7–25)
CALCIUM,IONIZED: 5.1 mg/dL (ref 4.40–5.40)
CALCIUM,IONIZED: 5.1 mg/dL (ref 4.40–5.40)
CO2 Total: 27 mmol/L (ref 21–32)
CO2, TOTAL: 27 mmol/L (ref 21–32)
Chloride: 100 mEq/L (ref 98–107)
Chloride: 100 mEq/L (ref 98–107)
Creatinine: 1 mg/dl (ref 0.6–1.3)
Creatinine: 1 mg/dl (ref 0.6–1.3)
Glucose: 437 mg/dL — ABNORMAL HIGH (ref 74–106)
Glucose: 437 mg/dL — ABNORMAL HIGH (ref 74–106)
HCT: 40 % (ref 38–45)
HGB: 13.6 gm/dl (ref 12.4–17.2)
Hematocrit: 40 % (ref 38–45)
Hemoglobin: 13.6 gm/dl (ref 12.4–17.2)
Potassium: 4.1 mEq/L (ref 3.5–4.9)
Potassium: 4.1 mEq/L (ref 3.5–4.9)
Sodium: 136 mEq/L (ref 136–145)
Sodium: 136 mEq/L (ref 136–145)

## 2021-09-30 LAB — METABOLIC PANEL, COMPREHENSIVE
ALT (SGPT): 22 U/L (ref 10–49)
AST (SGOT): 19 U/L (ref 0.0–33.9)
Albumin: 2.4 gm/dl — ABNORMAL LOW (ref 3.4–5.0)
Alk. phosphatase: 175 U/L — ABNORMAL HIGH (ref 46–116)
Anion gap: 10 mmol/L (ref 5–15)
BUN: 16 mg/dl (ref 9–23)
Bilirubin, total: 0.5 mg/dl (ref 0.30–1.20)
CO2: 25 mEq/L (ref 20–31)
Calcium: 10.5 mg/dl — ABNORMAL HIGH (ref 8.7–10.4)
Chloride: 99 mEq/L (ref 98–107)
Creatinine: 1.22 mg/dl — ABNORMAL HIGH (ref 0.55–1.02)
GFR est AA: 58
GFR est non-AA: 48
Glucose: 432 mg/dl — CR (ref 74–106)
Potassium: 4 mEq/L (ref 3.5–5.1)
Protein, total: 7.5 gm/dl (ref 5.7–8.2)
Sodium: 134 mEq/L — ABNORMAL LOW (ref 136–145)

## 2021-09-30 LAB — POC URINE MACROSCOPIC
Bilirubin, Urine: NEGATIVE
Bilirubin: NEGATIVE
Blood, Urine: NEGATIVE
Blood: NEGATIVE
Glucose, Ur: >=1000 mg/dl — AB
Glucose: >=1000 mg/dl — AB
Ketone: NEGATIVE mg/dl
Ketones, Urine: NEGATIVE mg/dl
Nitrite, Urine: NEGATIVE
Nitrites: NEGATIVE
Protein, UA: NEGATIVE mg/dl
Protein: NEGATIVE mg/dl
Specific Gravity, UA: 1.015 (ref 1.005–1.030)
Specific gravity: 1.015 (ref 1.005–1.030)
Urobilinogen, UA, POCT: 0.2 EU/dl (ref 0.0–1.0)
Urobilinogen: 0.2 EU/dl (ref 0.0–1.0)
pH (UA): 5 (ref 5–9)
pH, UA: 5 (ref 5–9)

## 2021-09-30 LAB — CBC WITH AUTOMATED DIFF
BASOPHILS: 0.6 % (ref 0–3)
EOSINOPHILS: 1.3 % (ref 0–5)
HCT: 36.9 % — ABNORMAL LOW (ref 37.0–50.0)
HGB: 12.6 gm/dl — ABNORMAL LOW (ref 13.0–17.2)
IMMATURE GRANULOCYTES: 0.3 % (ref 0.0–3.0)
LYMPHOCYTES: 30.9 % (ref 28–48)
MCH: 28.1 pg (ref 25.4–34.6)
MCHC: 34.1 gm/dl (ref 30.0–36.0)
MCV: 82.4 fL (ref 80.0–98.0)
MONOCYTES: 5.7 % (ref 1–13)
MPV: 10.3 fL — ABNORMAL HIGH (ref 6.0–10.0)
NEUTROPHILS: 61.2 % (ref 34–64)
NRBC: 0 (ref 0–0)
PLATELET: 256 10*3/uL (ref 140–450)
RBC: 4.48 M/uL (ref 3.60–5.20)
RDW-SD: 36.9 (ref 36.4–46.3)
WBC: 8.7 10*3/uL (ref 4.0–11.0)

## 2021-09-30 LAB — POC BLOOD GAS
BASE EXCESS: 3 mmol/L (ref <=3)
BICARBONATE: 27.4 mmol/L — ABNORMAL HIGH (ref 22.0–26.0)
Base Excess: 3 mmol/L (ref <=3)
CO2 Total: 29 mmol/L (ref 21–32)
CO2, TOTAL: 29 mmol/L (ref 21–32)
HCO3: 27.4 mmol/L — ABNORMAL HIGH (ref 22.0–26.0)
O2 SAT: 83 % — ABNORMAL HIGH (ref 70–75)
O2 Sat: 83 % — ABNORMAL HIGH (ref 70–75)
PCO2: 41.4 mm Hg (ref 41.0–51.0)
PCO2: 41.4 mm Hg (ref 41.0–51.0)
PO2: 46 mm Hg — ABNORMAL HIGH (ref 35–40)
PO2: 46 mm Hg — ABNORMAL HIGH (ref 35–40)
pH: 7.428 — ABNORMAL HIGH (ref 7.310–7.410)
pH: 7.428 — ABNORMAL HIGH (ref 7.310–7.410)

## 2021-09-30 LAB — GLUCOSE, POC
Glucose (POC): 419 mg/dL — CR (ref 65–105)
Glucose (POC): 76 mg/dL (ref 65–105)

## 2021-09-30 LAB — POC URINE MICROSCOPIC

## 2021-09-30 LAB — CBC WITH AUTO DIFFERENTIAL
Basophils %: 0.6 % (ref 0–3)
Eosinophils %: 1.3 % (ref 0–5)
Hematocrit: 36.9 % — ABNORMAL LOW (ref 37.0–50.0)
Hemoglobin: 12.6 gm/dl — ABNORMAL LOW (ref 13.0–17.2)
Immature Granulocytes: 0.3 % (ref 0.0–3.0)
Lymphocytes %: 30.9 % (ref 28–48)
MCH: 28.1 pg (ref 25.4–34.6)
MCHC: 34.1 gm/dl (ref 30.0–36.0)
MCV: 82.4 fL (ref 80.0–98.0)
MPV: 10.3 fL — ABNORMAL HIGH (ref 6.0–10.0)
Monocytes %: 5.7 % (ref 1–13)
Neutrophils %: 61.2 % (ref 34–64)
Nucleated RBCs: 0 (ref 0–0)
Platelets: 256 10*3/uL (ref 140–450)
RBC: 4.48 M/uL (ref 3.60–5.20)
RDW-SD: 36.9 (ref 36.4–46.3)
WBC: 8.7 10*3/uL (ref 4.0–11.0)

## 2021-09-30 LAB — COMPREHENSIVE METABOLIC PANEL
ALT: 22 U/L (ref 10–49)
AST: 19 U/L (ref 0.0–33.9)
Albumin: 2.4 gm/dl — ABNORMAL LOW (ref 3.4–5.0)
Alkaline Phosphatase: 175 U/L — ABNORMAL HIGH (ref 46–116)
Anion Gap: 10 mmol/L (ref 5–15)
BUN: 16 mg/dl (ref 9–23)
CO2: 25 mEq/L (ref 20–31)
Calcium: 10.5 mg/dl — ABNORMAL HIGH (ref 8.7–10.4)
Chloride: 99 mEq/L (ref 98–107)
Creatinine: 1.22 mg/dl — ABNORMAL HIGH (ref 0.55–1.02)
EGFR IF NonAfrican American: 48
GFR African American: 58
Glucose: 432 mg/dl (ref 74–106)
Potassium: 4 mEq/L (ref 3.5–5.1)
Sodium: 134 mEq/L — ABNORMAL LOW (ref 136–145)
Total Bilirubin: 0.5 mg/dl (ref 0.30–1.20)
Total Protein: 7.5 gm/dl (ref 5.7–8.2)

## 2021-09-30 LAB — POCT GLUCOSE
POC Glucose: 419 mg/dL (ref 65–105)
POC Glucose: 76 mg/dL (ref 65–105)

## 2021-09-30 MED ORDER — INSULIN REGULAR HUMAN 100 UNIT/ML INJECTION
100 unit/mL | Freq: Once | INTRAMUSCULAR | Status: AC
Start: 2021-09-30 — End: 2021-09-29
  Administered 2021-09-30: 03:00:00 via INTRAVENOUS

## 2021-09-30 MED ORDER — SODIUM CHLORIDE 0.9 % IJ SYRG
Freq: Once | INTRAMUSCULAR | Status: AC
Start: 2021-09-30 — End: 2021-09-29
  Administered 2021-09-30: 02:00:00 via INTRAVENOUS

## 2021-09-30 MED ORDER — SODIUM CHLORIDE 0.9 % IV PIGGY BACK
1 gram | INTRAVENOUS | Status: AC
Start: 2021-09-30 — End: 2021-09-29
  Administered 2021-09-30: 03:00:00 via INTRAVENOUS

## 2021-09-30 MED ORDER — SODIUM CHLORIDE 0.9% BOLUS IV
0.9 % | INTRAVENOUS | Status: AC
Start: 2021-09-30 — End: 2021-09-30
  Administered 2021-09-30: 03:00:00 via INTRAVENOUS

## 2021-09-30 MED FILL — CEFTRIAXONE 1 GRAM SOLUTION FOR INJECTION: 1 gram | INTRAMUSCULAR | Qty: 1

## 2021-09-30 MED FILL — HUMULIN R REGULAR U-100 INSULIN 100 UNIT/ML INJECTION SOLUTION: 100 unit/mL | INTRAMUSCULAR | Qty: 1

## 2021-09-30 NOTE — ED Notes (Signed)
Pt BGL checked. PA made aware. Pt provided food.

## 2021-09-30 NOTE — ED Notes (Signed)
Discharge instructions given to patient with verbalization of understanding. Patient accompanied by family.  Patient discharged with and provided education on the following prescriptions-      My Medications      ASK your doctor about these medications      Instructions Each Dose to Equal Morning Noon Evening Bedtime   BIOTIN-ALPHA LIPOIC A-MIN-HB91 PO    Your last dose was:     Your next dose is:         Take  by mouth.                  HumaLOG U-100 Insulin 100 unit/mL injection  Generic drug: insulin lispro    Your last dose was:     Your next dose is:         100 Units by SubCUTAneous route.   100 Units                 multivitamin tablet  Commonly known as: ONE A DAY    Your last dose was:     Your next dose is:         Take 1 Tab by mouth daily.   1 Tablet                 valsartan-hydroCHLOROthiazide 160-25 mg per tablet  Commonly known as: DIOVAN-HCT    Your last dose was:     Your next dose is:         Take 1 Tab by mouth daily.   1 Tablet                    . Verbalizes understanding. Follow-up information provided. Discharged to home.

## 2021-09-30 NOTE — ED Provider Notes (Signed)
[place]  Emergency ObservationDepartment   Discharge Summary    Patient: Michelle Herring Age: 55 y.o. Sex: female    Date of Birth: 1966/01/25 Admit Date: 09/29/2021 PCP: [facility]   MRN: [id number]  CSN: [id number]     Room: [place]       ED Physician   Dr. Truddie Crumble  Discharge Physician   Dr. Truddie Crumble  Observation Admission   09/29/21 2139  Observation Discharge  09/30/21 0029    History of Present Illness   55 y.o. female presented the emergency department with history of diabetes mellitus and persistently elevated blood sugar reading "high" at home was in the 400 range here.  She was evaluated emerged part placed in ED observation status to continue hydration and treatments and monitoring of her blood glucose    ED Observation Course   Patient's blood work returned showing she was not acidotic electrolytes within normal limits.  She was given a liter of fluid and 6 units of regular insulin IV.  She was observed her sugar came down to 73.  She was given something to eat.  We did give her dose of Rocephin IV for her urine.  She will finish the course of Bactrim.  I sent her urine for culture.  And if her culture returns still positive she will require further treatment and follow-up with her doctor for further evaluation.  Possible that her infection is driving her hyperglycemia.  She will check her sugar before going to bed.  She was given the opportunity ask questions and she is comfortable with this plan.      Physical Exam   Visit Vitals  BP 138/76   Pulse 79   Temp 97 ??F (36.1 ??C)   Resp 14   Ht '5\' 3"'  (1.6 m)   Wt 88 kg (194 lb)   SpO2 95%   BMI 34.37 kg/m??      Lungs are clear to auscultation  Heart regular rate and rhythm  Neurologic awake alert oriented  Results for orders placed or performed during the hospital encounter of 09/29/21   CBC WITH AUTOMATED DIFF   Result Value Ref Range    WBC 8.7 4.0 - 11.0 1000/mm3    RBC 4.48 3.60 - 5.20 M/uL    HGB 12.6 (L) 13.0 - 17.2  gm/dl    HCT 36.9 (L) 37.0 - 50.0 %    MCV 82.4 80.0 - 98.0 fL    MCH 28.1 25.4 - 34.6 pg    MCHC 34.1 30.0 - 36.0 gm/dl    PLATELET 256 140 - 450 1000/mm3    MPV 10.3 (H) 6.0 - 10.0 fL    RDW-SD 36.9 36.4 - 46.3      NRBC 0 0 - 0      IMMATURE GRANULOCYTES 0.3 0.0 - 3.0 %    NEUTROPHILS 61.2 34 - 64 %    LYMPHOCYTES 30.9 28 - 48 %    MONOCYTES 5.7 1 - 13 %    EOSINOPHILS 1.3 0 - 5 %    BASOPHILS 0.6 0 - 3 %   METABOLIC PANEL, COMPREHENSIVE   Result Value Ref Range    Potassium 4.0 3.5 - 5.1 mEq/L    Chloride 99 98 - 107 mEq/L    Sodium 134 (L) 136 - 145 mEq/L    CO2 25 20 - 31 mEq/L    Glucose 432 (HH) 74 - 106 mg/dl    BUN 16 9 -  23 mg/dl    Creatinine 1.22 (H) 0.55 - 1.02 mg/dl    GFR est AA 58.0      GFR est non-AA 48      Calcium 10.5 (H) 8.7 - 10.4 mg/dl    Anion gap 10 5 - 15 mmol/L    AST (SGOT) 19.0 0.0 - 33.9 U/L    ALT (SGPT) 22 10 - 49 U/L    Alk. phosphatase 175 (H) 46 - 116 U/L    Bilirubin, total 0.50 0.30 - 1.20 mg/dl    Protein, total 7.5 5.7 - 8.2 gm/dl    Albumin 2.4 (L) 3.4 - 5.0 gm/dl   GLUCOSE, POC   Result Value Ref Range    Glucose (POC) 419 (HH) 65 - 105 mg/dL   POC URINE MACROSCOPIC   Result Value Ref Range    Glucose >=1000 (A) NEGATIVE,Negative mg/dl    Bilirubin Negative NEGATIVE,Negative      Ketone Negative NEGATIVE,Negative mg/dl    Specific gravity 1.015 1.005 - 1.030      Blood Negative NEGATIVE,Negative      pH (UA) 5.0 5 - 9      Protein Negative NEGATIVE,Negative mg/dl    Urobilinogen 0.2 0.0 - 1.0 EU/dl    Nitrites Negative NEGATIVE,Negative      Leukocyte Esterase Trace (A) NEGATIVE,Negative      Color Yellow      Appearance Clear     POC URINE MICROSCOPIC   Result Value Ref Range    WBC 1-4 /HPF   POC CHEM8   Result Value Ref Range    Sodium 136 136 - 145 mEq/L    Potassium 4.1 3.5 - 4.9 mEq/L    Chloride 100 98 - 107 mEq/L    CO2, TOTAL 27 21 - 32 mmol/L    Glucose 437 (H) 74 - 106 mg/dL    BUN 19 7 - 25 mg/dl    Creatinine 1.0 0.6 - 1.3 mg/dl    HCT 40 38 - 45 %    HGB 13.6  12.4 - 17.2 gm/dl    CALCIUM,IONIZED 5.10 4.40 - 5.40 mg/dL   POC BLOOD GAS   Result Value Ref Range    pH 7.428 (H) 7.310 - 7.410      PCO2 41.4 41.0 - 51.0 mm Hg    PO2 46.0 (H) 35 - 40 mm Hg    BICARBONATE 27.4 (H) 22.0 - 26.0 mmol/L    O2 SAT 83.0 (H) 70 - 75 %    CO2, TOTAL 29 21 - 32 mmol/L    BASE EXCESS 3 -2 - 3 mmol/L    Sample type VEN     GLUCOSE, POC   Result Value Ref Range    Glucose (POC) 76 65 - 105 mg/dL      Clinical Impression/diagnosis       ICD-10-CM ICD-9-CM   1. Type 1 diabetes mellitus with hyperglycemia (HCC)  E10.65 250.01   2. Dehydration  E86.0 276.51   3. Acute cystitis without hematuria  N30.00 595.0         Disposition and Plan   Patient is discharged home in stable condition. Advised to follow up with PCP.  Patient advised to return for any new or worsening symptoms.  Patient evaluated by myself and Dr. Truddie Crumble who agrees with the above assessment and plan.     Current Discharge Medication List          Dragon medical dictation software was used for portions  of this report. Unintended errors may occur.    Fuller Song, PA-C  September 30, 2021

## 2021-10-01 LAB — CULTURE, URINE
CULTURE RESULT: 30000
Culture Result: 30000

## 2022-08-01 ENCOUNTER — Emergency Department: Admit: 2022-08-01 | Payer: PRIVATE HEALTH INSURANCE

## 2022-08-01 ENCOUNTER — Inpatient Hospital Stay
Admission: EM | Admit: 2022-08-01 | Discharge: 2022-08-04 | Disposition: A | Discharge status: Home / Self Care | Payer: PRIVATE HEALTH INSURANCE | Admitting: Specialist

## 2022-08-01 ENCOUNTER — Inpatient Hospital Stay: Admit: 2022-08-01 | Payer: PRIVATE HEALTH INSURANCE

## 2022-08-01 DIAGNOSIS — S82141A Displaced bicondylar fracture of right tibia, initial encounter for closed fracture: Secondary | ICD-10-CM

## 2022-08-01 LAB — POCT GLUCOSE
POC Glucose: 448 mg/dL (ref 65–105)
POC Glucose: 418 mg/dL (ref 65–105)
POC Glucose: 409 mg/dL (ref 65–105)

## 2022-08-01 LAB — POC CHEM 8
BUN: 13 mg/dl (ref 7–25)
Calcium, Ionized: 5 mg/dL (ref 4.40–5.40)
Chloride: 108 mEq/L — ABNORMAL HIGH (ref 98–107)
Creatinine: 0.8 mg/dl (ref 0.6–1.3)
Glucose: 517 mg/dL (ref 74–106)
Hematocrit: 36 % — ABNORMAL LOW (ref 38–45)
Hemoglobin: 12.2 gm/dl — ABNORMAL LOW (ref 12.4–17.2)
Potassium: 4.3 mEq/L (ref 3.5–4.9)
Sodium: 139 mEq/L (ref 136–145)
Total CO2: 26 mmol/L (ref 21–32)

## 2022-08-01 MED ORDER — VALSARTAN-HYDROCHLOROTHIAZIDE 320-12.5 MG PO TABS
Freq: Every day | ORAL | Status: DC
Start: 2022-08-01 — End: 2022-08-01

## 2022-08-01 MED ORDER — POLYETHYLENE GLYCOL 3350 17 G PO PACK
17 g | Freq: Every day | ORAL | Status: AC
Start: 2022-08-01 — End: ?
  Administered 2022-08-03 – 2022-08-04 (×2): 17 g via ORAL

## 2022-08-01 MED ORDER — INSULIN LISPRO 100 UNIT/ML IJ SOLN
100 UNIT/ML | Freq: Four times a day (QID) | INTRAMUSCULAR | Status: AC
Start: 2022-08-01 — End: ?
  Administered 2022-08-02: 13:00:00 5 [IU] via SUBCUTANEOUS
  Administered 2022-08-02: 02:00:00 20 [IU] via SUBCUTANEOUS
  Administered 2022-08-02: 23:00:00 5 [IU] via SUBCUTANEOUS
  Administered 2022-08-03: 13:00:00 11 [IU] via SUBCUTANEOUS
  Administered 2022-08-03: 21:00:00 14 [IU] via SUBCUTANEOUS
  Administered 2022-08-03: 03:00:00 6 [IU] via SUBCUTANEOUS
  Administered 2022-08-03 – 2022-08-04 (×2): 5 [IU] via SUBCUTANEOUS
  Administered 2022-08-04: 01:00:00 7 [IU] via SUBCUTANEOUS
  Administered 2022-08-04: 13:00:00 20 [IU] via SUBCUTANEOUS

## 2022-08-01 MED ORDER — SODIUM CHLORIDE 0.9 % IV SOLN
0.9 % | INTRAVENOUS | Status: AC
Start: 2022-08-01 — End: 2022-08-03
  Administered 2022-08-01 – 2022-08-03 (×3): via INTRAVENOUS

## 2022-08-01 MED ORDER — OXYCODONE-ACETAMINOPHEN 5-325 MG PO TABS
5-325 MG | ORAL | Status: DC | PRN
Start: 2022-08-01 — End: 2022-08-02
  Administered 2022-08-01: 21:00:00 1 via ORAL

## 2022-08-01 MED ORDER — MORPHINE SULFATE (PF) 10 MG/ML IV SOLN
10 MG/ML | INTRAVENOUS | Status: DC | PRN
Start: 2022-08-01 — End: 2022-08-01
  Administered 2022-08-01: 20:00:00 8 mg via INTRAVENOUS

## 2022-08-01 MED ORDER — INSULIN REGULAR HUMAN 100 UNIT/ML IJ SOLN
100 UNIT/ML | INTRAMUSCULAR | Status: AC
Start: 2022-08-01 — End: 2022-08-01
  Administered 2022-08-01: 21:00:00 10 [IU] via INTRAVENOUS

## 2022-08-01 MED ORDER — VALSARTAN 80 MG PO TABS
80 MG | Freq: Every day | ORAL | Status: AC
Start: 2022-08-01 — End: ?
  Administered 2022-08-01 – 2022-08-04 (×4): 320 mg via ORAL

## 2022-08-01 MED ORDER — SENNOSIDES 8.6 MG PO TABS
8.6 MG | Freq: Every evening | ORAL | Status: AC
Start: 2022-08-01 — End: ?
  Administered 2022-08-03 – 2022-08-04 (×2): 8.6 via ORAL

## 2022-08-01 MED ORDER — DEXTROSE 50 % IV SOLN
50 % | INTRAVENOUS | Status: AC | PRN
Start: 2022-08-01 — End: ?

## 2022-08-01 MED ORDER — HYDROCHLOROTHIAZIDE 25 MG PO TABS
25 MG | Freq: Every day | ORAL | Status: AC
Start: 2022-08-01 — End: ?
  Administered 2022-08-01 – 2022-08-04 (×3): 12.5 mg via ORAL

## 2022-08-01 MED ORDER — INSULIN LISPRO 100 UNIT/ML IJ SOLN
100 UNIT/ML | INTRAMUSCULAR | Status: AC | PRN
Start: 2022-08-01 — End: ?

## 2022-08-01 MED ORDER — ONDANSETRON HCL 4 MG/2ML IJ SOLN
4 MG/2ML | Freq: Once | INTRAMUSCULAR | Status: AC
Start: 2022-08-01 — End: 2022-08-01
  Administered 2022-08-01: 20:00:00 4 mg via INTRAVENOUS

## 2022-08-01 MED ORDER — INSULIN GLARGINE 100 UNIT/ML SC SOLN
100 UNIT/ML | Freq: Every evening | SUBCUTANEOUS | Status: AC
Start: 2022-08-01 — End: ?
  Administered 2022-08-02: 02:00:00 23 [IU] via SUBCUTANEOUS
  Administered 2022-08-03: 03:00:00 30 [IU] via SUBCUTANEOUS
  Administered 2022-08-04: 01:00:00 39 [IU] via SUBCUTANEOUS

## 2022-08-01 MED ORDER — MORPHINE SULFATE 4 MG/ML IJ SOLN
4 MG/ML | INTRAMUSCULAR | Status: DC
Start: 2022-08-01 — End: 2022-08-01

## 2022-08-01 MED ORDER — GLUCAGON HCL RDNA (DIAGNOSTIC) 1 MG IJ SOLR
1 MG | INTRAMUSCULAR | Status: AC | PRN
Start: 2022-08-01 — End: ?

## 2022-08-01 MED FILL — HUMULIN R 100 UNIT/ML IJ SOLN: 100 UNIT/ML | INTRAMUSCULAR | Qty: 1

## 2022-08-01 MED FILL — VALSARTAN 320 MG PO TABS: 320 MG | ORAL | Qty: 1

## 2022-08-01 MED FILL — ONDANSETRON HCL 4 MG/2ML IJ SOLN: 4 MG/2ML | INTRAMUSCULAR | Qty: 2

## 2022-08-01 MED FILL — OXYCODONE-ACETAMINOPHEN 5-325 MG PO TABS: 5-325 MG | ORAL | Qty: 1

## 2022-08-01 MED FILL — HYDROCHLOROTHIAZIDE 25 MG PO TABS: 25 MG | ORAL | Qty: 1

## 2022-08-01 MED FILL — MORPHINE SULFATE 10 MG/ML IV SOLN: 10 mg/mL | INTRAVENOUS | Qty: 1

## 2022-08-01 NOTE — ED Provider Notes (Cosign Needed)
[facility]  Emergency Department Treatment Report        Patient: Michelle Herring Age: 56 y.o. Sex: female    Date of Birth: [birth year] Admit Date: 08/01/2022 PCP: No primary care provider on file.   MRN: [id number]  CSN: [id number]     Room: 2118/2118 Time Dictated: 10:50 PM      Attending MD: Dr. Charlett Nose   APC: Lavone Nian, PA-C    Chief Complaint   Chief Complaint   Patient presents with    Leg Injury       History of Present Illness   56 y.o. female who presents emergency department with right lower leg pain brought in by EMS following a trip and fall down 2 steps at home.  She states that she was carrying boxes and misjudged the footing.  She reports that she fell to the ground and has been unable to weight-bear on this leg since that time.  She has had previous ankle injury.  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  She denies associated symptoms causing her to fall.  She denies hitting her head or headache.  She denies anticoagulation.  She denies other injury.  Review of Systems   Review of Systems   Constitutional:  Negative for fatigue.   Respiratory:  Negative for shortness of breath.    Cardiovascular:  Negative for chest pain.   Gastrointestinal:  Negative for abdominal pain, diarrhea and nausea.   Musculoskeletal:  Positive for arthralgias.   Skin:  Negative for wound.   Neurological:  Negative for dizziness and headaches.   Psychiatric/Behavioral:  Negative for confusion.        Past Medical/Surgical History     Past Medical History:   Diagnosis Date    Diabetes mellitus, type II, insulin dependent (HCC)     Dyslipidemia     HTN (hypertension)     Obesity     OSA on CPAP      Past Surgical History:   Procedure Laterality Date    APPENDECTOMY  1984    CESAREAN SECTION  1984; 1986; 1989    HYSTERECTOMY (CERVIX STATUS UNKNOWN)  1998     Social History     Social History     Socioeconomic History    Marital status: Legally Separated     Spouse name: Not on file     Number of children: Not on file    Years of education: Not on file    Highest education level: Not on file   Occupational History    Not on file   Tobacco Use    Smoking status: Never    Smokeless tobacco: Never   Substance and Sexual Activity    Alcohol use: Yes    Drug use: No    Sexual activity: Not on file   Other Topics Concern    Not on file   Social History Narrative    Not on file     Social Determinants of Health     Financial Resource Strain: Not on file   Food Insecurity: Not on file   Transportation Needs: Not on file   Physical Activity: Not on file   Stress: Not on file   Social Connections: Not on file   Intimate Partner Violence: Not on file   Housing Stability: Not on file     Family History     Family History   Problem Relation Age of Onset  Diabetes Mother     Cancer Father     Diabetes Father     Hypertension Father     Stroke Paternal Grandmother      Current Medications     Current Discharge Medication List        CONTINUE these medications which have NOT CHANGED    Details   omeprazole (PRILOSEC) 10 MG delayed release capsule Take 1 capsule by mouth daily      NOVOLOG RELION 100 UNIT/ML injection vial USE UP TO 80 UNITS DAILY VIA INSULIN PUMP      valsartan-hydroCHLOROthiazide (DIOVAN-HCT) 320-12.5 MG per tablet Take 1 tablet by mouth daily           Allergies   No Known Allergies    Physical Exam     ED Triage Vitals [08/01/22 1327]   BP Temp Temp Source Pulse Respirations SpO2 Height Weight - Scale   (!) 151/65 98.3 F (36.8 C) Oral 78 18 95 % 1.6 m 90.7 kg     Physical Exam  Vitals and nursing note reviewed.   Constitutional:       Appearance: Normal appearance.   HENT:      Head: Normocephalic and atraumatic.   Eyes:      Conjunctiva/sclera: Conjunctivae normal.   Cardiovascular:      Rate and Rhythm: Normal rate and regular rhythm.      Pulses: Normal pulses.      Heart sounds: Normal heart sounds.   Pulmonary:      Effort: Pulmonary effort is normal.      Breath sounds: Normal  breath sounds.   Abdominal:      Tenderness: There is no abdominal tenderness.   Musculoskeletal:      Cervical back: Normal range of motion and neck supple.      Comments: Pain and edema to palpation over the tibial plateau with focal tenderness to palpation.  No significant patella or proximal or distal femur tenderness to palpation.  Mild diffuse tenderness to palpation through the mid tibia.  Distal dorsalis pedis pulses equal and intact bilaterally.  Distal sensation and cap refill intact bilaterally.  No obvious tenderness palpation over the hip, pelvis, or lumbar spine.   Skin:     Findings: No lesion.   Neurological:      General: No focal deficit present.      Mental Status: She is alert and oriented to person, place, and time.   Psychiatric:         Mood and Affect: Mood normal.         Thought Content: Thought content normal.         Impression and Management Plan   Procedures will obtain x-ray of the knee and tib-fib.  Neurovascularly intact.  Denies other injuries.  Denies head injury.  Diagnostic Studies   Lab:   Recent Results (from the past 12 hour(s))   POCT Glucose    Collection Time: 08/01/22  4:01 PM   Result Value Ref Range    POC Glucose 448 (HH) 65 - 105 mg/dL   POC CHEM 8    Collection Time: 08/01/22  4:10 PM   Result Value Ref Range    Sodium 139 136 - 145 mEq/L    Potassium 4.3 3.5 - 4.9 mEq/L    Chloride 108 (H) 98 - 107 mEq/L    Total CO2 26 21 - 32 mmol/L    Glucose 517 (HH) 74 - 106 mg/dL    BUN 13  7 - 25 mg/dl    Creatinine 0.8 0.6 - 1.3 mg/dl    Hematocrit 36 (L) 38 - 45 %    Hemoglobin 12.2 (L) 12.4 - 17.2 gm/dl    Calcium, Ionized 2.95 4.40 - 5.40 mg/dL   POCT Glucose    Collection Time: 08/01/22  5:27 PM   Result Value Ref Range    POC Glucose 418 (HH) 65 - 105 mg/dL   POCT Glucose    Collection Time: 08/01/22  7:10 PM   Result Value Ref Range    POC Glucose 409 (HH) 65 - 105 mg/dL   POCT Glucose    Collection Time: 08/01/22  9:01 PM   Result Value Ref Range    POC Glucose 445 (HH)  65 - 105 mg/dL   CBC with Auto Differential    Collection Time: 08/01/22  9:27 PM   Result Value Ref Range    WBC 10.5 4.0 - 11.0 1000/mm3    RBC 3.77 3.60 - 5.20 M/uL    Hemoglobin 10.2 (L) 11.0 - 16.0 gm/dl    Hematocrit 62.1 (L) 35.0 - 47.0 %    MCV 84.6 80.0 - 98.0 fL    MCH 27.1 25.4 - 34.6 pg    MCHC 32.0 30.0 - 36.0 gm/dl    Platelets 308 657 - 450 1000/mm3    MPV 10.5 (H) 6.0 - 10.0 fL    RDW 40.1 36.4 - 46.3      Nucleated RBCs 0 0 - 0      Immature Granulocytes 0.3 0.0 - 3.0 %    Neutrophils Segmented 79.5 (H) 34 - 64 %    Lymphocytes 13.3 (L) 28 - 48 %    Monocytes 6.3 1 - 13 %    Eosinophils 0.1 0 - 5 %    Basophils 0.5 0 - 3 %   Protime-INR    Collection Time: 08/01/22  9:27 PM   Result Value Ref Range    Protime 11.3 10.2 - 12.9 seconds    INR 1.0 0.1 - 1.1       Labs Reviewed   CBC WITH AUTO DIFFERENTIAL - Abnormal; Notable for the following components:       Result Value    Hemoglobin 10.2 (*)     Hematocrit 31.9 (*)     MPV 10.5 (*)     Neutrophils Segmented 79.5 (*)     Lymphocytes 13.3 (*)     All other components within normal limits   POCT GLUCOSE - Abnormal; Notable for the following components:    POC Glucose 448 (*)     All other components within normal limits   POC CHEM 8 - Abnormal; Notable for the following components:    Chloride 108 (*)     Glucose 517 (*)     Hematocrit 36 (*)     Hemoglobin 12.2 (*)     All other components within normal limits   POCT GLUCOSE - Abnormal; Notable for the following components:    POC Glucose 418 (*)     All other components within normal limits   POCT GLUCOSE - Abnormal; Notable for the following components:    POC Glucose 409 (*)     All other components within normal limits   POCT GLUCOSE - Abnormal; Notable for the following components:    POC Glucose 445 (*)     All other components within normal limits   PROTIME-INR   COMPREHENSIVE METABOLIC PANEL   POCT GLUCOSE  POCT GLUCOSE   POCT GLUCOSE   POCT GLUCOSE   ABO/RH    Narrative:     Specimen is  valid for 3 days - nurse to verify valid specimen   ANTIBODY SCREEN    Narrative:     Specimen is valid for 3 days - nurse to verify valid specimen   TYPE AND SCREEN       XR CHEST PORTABLE   Final Result   IMPRESSION:   No acute pulmonary process.      Electronically signed by: Jolayne Panther, MD 08/01/2022 6:05 PM EDT             Workstation ID: [id number]         XR KNEE RIGHT (MIN 4 VIEWS)   Final Result   IMPRESSION:   Comminuted fracture medial and lateral tibial plateau.      Electronically signed by: Jolayne Panther, MD 08/01/2022 4:07 PM EDT             Workstation ID: [id number]         XR TIBIA FIBULA RIGHT (2 VIEWS)   Final Result   IMPRESSION:   Comminuted medial and lateral tibial plateau fracture.      Electronically signed by: Jolayne Panther, MD 08/01/2022 4:09 PM EDT             Workstation ID: [id number]           ED Course   Patient felt significant for improvement following dose of morphine given in the ED.      Patient returned from x-ray stating that she did not feel well and wanted her sugar to be checked.  She states it was 200 this morning.       Medications   0.9 % sodium chloride infusion ( IntraVENous New Bag 08/01/22 1701)   oxyCODONE-acetaminophen (PERCOCET) 5-325 MG per tablet 1 tablet (1 tablet Oral Given 08/01/22 1725)   dextrose 50 % IV solution (has no administration in time range)   glucagon (rDNA) injection 1 mg (has no administration in time range)   insulin glargine (LANTUS) injection vial 1-100 Units (23 Units SubCUTAneous Given 08/01/22 2144)   insulin lispro (HUMALOG) injection vial 1-100 Units (20 Units SubCUTAneous Given 08/01/22 2144)   insulin lispro (HUMALOG) injection vial 1-100 Units (has no administration in time range)   valsartan (DIOVAN) tablet 320 mg (320 mg Oral Given 08/01/22 1843)     And   hydroCHLOROthiazide (HYDRODIURIL) tablet 12.5 mg (12.5 mg Oral Given 08/01/22 1843)   senna (SENOKOT) tablet 8.6 mg (has no administration in time range)   polyethylene glycol (GLYCOLAX)  packet 17 g (has no administration in time range)   omeprazole (PRILOSEC) delayed release capsule 20 mg (20 mg Oral Given 08/01/22 2145)   morphine injection 4 mg (4 mg IntraVENous Given 08/01/22 2145)   ondansetron (ZOFRAN) injection 4 mg (4 mg IntraVENous Given 08/01/22 1557)   insulin regular (HUMULIN R;NOVOLIN R) injection 10 Units (10 Units IntraVENous Given 08/01/22 1707)     SOCIAL DETERMINANTS impacting Evaluation and Management: Household, support system    Comorbidities impacting Evaluation and Management: Diabetes and hypertension      Medical Decision Making     56 y.o. female with hyperglycemia without evidence of acidosis.  10 units of IV insulin were administered and will recheck serum glucose.  She admitted that was 200 this morning and thought that her insulin pump had administered insulin.  She states that the cartridge was not functioning appropriately  and she must of forgotten to cycle through appropriately.    Patient's x-ray of the right tib-fib and knee shows tibial plateau fracture that is comminuted.  Will consult on-call Ortho.    Case discussed with Dr. Lyna Poser on-call orthopedic surgeon.  Patient was offered discharge to schedule outpatient surgery versus hospitalization for more immediate surgery.  Patient would prefer to stay in the hospital as she has been unable to weight-bear in any movement is causing significant pain.  Dr. Renaldo Harrison agreed and is comfortable with admission and requesting medicine admission.      Case discussed with Dr. Laren Boom who agreed with planned admission  for orthopedic surgery.  Discussed hyperglycemia.  She will consult on this patient.    Patient was placed in a knee immobilizer and into a bed with her leg extended.  Final Diagnosis     1. Tibial plateau fracture, right, closed, initial encounter    2. Hyperglycemia            Disposition   Admission    The patient was personally evaluated by myself and  discussed with Dr. Renae Fickle    who agrees  with the above assessment and plan.    Lavone Nian, PA-C  August 01, 2022    My signature above authenticates this document and my orders, the final    diagnosis (es), discharge prescription (s), and instructions in the Epic    record.  If you have any questions please contact [phone].     Nursing notes have been reviewed by the physician/ advanced practice    Clinician.    Dragon medical dictation software was used for portions of this report. Unintended voice recognition errors may occur.       Estevan Ryder [place], [place]  08/01/22 2258       Estevan Ryder [place], [place]  08/01/22 2258

## 2022-08-01 NOTE — ED Notes (Signed)
TRANSFER - OUT REPORT:    Verbal report given to receiving nurse on Bubba Hales being transferred to 2118 for routine progression of patient care       Report consisted of patient's Situation, Background, Assessment and   Recommendations(SBAR).     Information from the following report(s) Nurse Handoff Report, ED Encounter Summary, ED SBAR, [facility], Cardiac Rhythm sinus, and Neuro Assessment was reviewed with the receiving nurse.  Kinder Assessment: No data recorded  Lines:   Peripheral IV 08/01/22 Left;Proximal;Anterior Forearm (Active)      Medications sent with patient from pharmacy: None to send  Patient belongings: Belongings sent to floor    Opportunity for questions and clarification was provided.      Patient transported with:  Registered Nurse          Wonda Cerise, [place]  08/01/22 2022

## 2022-08-01 NOTE — ED Triage Notes (Signed)
Patient presents to the ED by EMS from home for evaluation of a RLE injury s/p falling down 2 steps. EMS splinted the RLE. Established a PIV and administered 25mg  IV Ketamine with relief in pain.

## 2022-08-01 NOTE — Consults (Signed)
Hospitalist consultation  Richarda Overlie D.O.     Patient: Michelle Herring Age: 56 y.o. Sex: female    Date of Birth: 1966/02/05 Admit Date: 08/01/2022 Admit Doctor: Arita Miss, MD   MRN: [id number]  CSN: [id number]  PCP: No primary care provider on file.       Assessment / Plan     Comminuted fracture medial/lateral right tibial plateau, s/p mechanical fall    IDDM 1 uncontrolled with hyperglycemia  Hypertension  OSA  Hx melanoma s/p amputation left great toe        Plan:  -Admit to MedSurg floor with orthopedic surgery as primary  -Noted plans for OR tomorrow for surgical correction of her fracture  -N.p.o. after midnight: Patient at risk of compartment syndrome, currently has palpable pulses able to dorsiflex plantarflex her right foot will need ongoing monitoring neurovascular checks  -Knee immobilizer requested per Ortho and placed by ER team  -Patient denies prior issue with anesthesia has had several surgeries before gastric sleeve appendectomy hysterectomy left great toe amputation  -We will check preop labs CBC CMP PT/INR type and screen as well as chest x-ray and preop EKG    -Continue home antihypertensive valsartan hydrochlorothiazide combination pill patient did not take it today we will order dose now  -Patient uses U100 NovoLog per insulin pump which she states is malfunctioning earlier likely triggering her elevated blood sugars here today  -We will continue IV fluids and start subcutaneous insulin per Glucomander protocol patient is aware to stop home insulin pump  -Senna and MiraLAX daily for bowel regimen  -Pain meds ordered per Ortho morphine IV as needed with Percocet oral as needed  -Continue home CPAP for OSA      DISPO  -Pt to be admitted to orthopedic surgery service at this time for reasons addressed above, continued hospitalization for ongoing assessment and treatment indicated         Code Status: Full code    MPOA: Daughter Carla Drape [phone]      Chief Complaint:   Chief  Complaint   Patient presents with    Leg Injury         HPI:   Michelle Herring is a 56 y.o. year old female past medical history of insulin-dependent diabetes type 1 on chronic insulin pump hypertension sleep apnea obesity.  Patient presents today with concern of a fall and knee pain.  She states that she lost her balance when going downstairs, she fell about 3 steps on carpeted stairs and she states "I just landed wrong ".    Is brought in by paramedics.  Prior to hospital patient had been administered IV ketamine for pain.    X-rays identified comminuted fracture of the right medial and lateral tibial plateau.  Ortho surgery consulted by ER for primary admission requesting hospitalist consultation for blood sugar control patient's blood sugar 517 on i-STAT.  Patient tells me that she was trying to exchange the tubing for her insulin pump and forgot 1 step including the insert therefore does not think she is been getting any insulin today.  She also did not take her blood pressure medication this morning.  She states the fall was entirely mechanical lost her balance denies any dizziness lightheadedness shortness of breath diaphoresis chest pain associated with this fall she remembers the entire event did not hit her head or lose consciousness.    She reports multiple prior surgeries including 3 C-sections, gastric sleeve, appendectomy, hysterectomy and left great  toe removal for malignant melanoma and denies any prior history of problems with anesthesia.          Review of Systems -   Review of Systems   Constitutional:  Negative for chills, fatigue and fever.   HENT:  Negative for congestion, sinus pressure and sore throat.    Eyes:  Negative for pain and visual disturbance.   Respiratory:  Negative for cough, shortness of breath and wheezing.    Cardiovascular:  Negative for chest pain, palpitations and leg swelling.   Gastrointestinal:  Negative for abdominal pain, nausea and vomiting.   Genitourinary:   Negative for dysuria and frequency.   Musculoskeletal:  Positive for joint swelling. Negative for back pain.   Skin:  Negative for color change and rash.   Neurological:  Negative for dizziness, weakness and headaches.   Psychiatric/Behavioral:  Negative for dysphoric mood. The patient is not nervous/anxious.            Past Medical History:  Past Medical History:   Diagnosis Date    Diabetes mellitus, type II, insulin dependent (HCC)     Dyslipidemia     HTN (hypertension)     Obesity     OSA on CPAP        Past Surgical History:  Past Surgical History:   Procedure Laterality Date    APPENDECTOMY  15    CESAREAN SECTION  1984; 1986; 1989    HYSTERECTOMY (CERVIX STATUS UNKNOWN)  1998   Amputation left great toe  Gastric sleeve surgery  Family History:  Family History   Problem Relation Age of Onset    Diabetes Mother     Cancer Father     Diabetes Father     Hypertension Father     Stroke Paternal Grandmother        Social History:  Social History     Socioeconomic History    Marital status: Legally Separated   Tobacco Use    Smoking status: Never    Smokeless tobacco: Never   Substance and Sexual Activity    Alcohol use: Yes    Drug use: No       Home Medications:  Prior to Admission medications    Medication Sig Start Date End Date Taking? Authorizing Provider   insulin lispro (HUMALOG) 100 UNIT/ML SOLN injection vial Inject 100 Units into the skin    Ar Automatic Reconciliation   valsartan-hydroCHLOROthiazide (DIOVAN-HCT) 160-25 MG per tablet Take 1 tablet by mouth daily    Ar Automatic Reconciliation       Allergies:  No Known Allergies      Physical Exam:   Visit Vitals  BP (!) 151/65   Pulse 78   Temp 98.3 F (36.8 C) (Oral)   Resp 18   Ht 5\' 3"  (1.6 m)   Wt 200 lb (90.7 kg)   SpO2 95%   BMI 35.43 kg/m     Physical Exam  Constitutional:       General: She is awake.      Appearance: Normal appearance. She is obese.   HENT:      Head: Normocephalic and atraumatic.      Mouth/Throat:      Mouth: Mucous  membranes are moist.   Eyes:      General: Lids are normal.      Pupils: Pupils are equal, round, and reactive to light.   Cardiovascular:      Rate and Rhythm: Normal rate and regular rhythm.  Heart sounds: Normal heart sounds, S1 normal and S2 normal.   Pulmonary:      Effort: Pulmonary effort is normal.      Breath sounds: Normal breath sounds.   Abdominal:      General: Abdomen is flat. Bowel sounds are normal.      Palpations: Abdomen is soft.      Tenderness: There is no abdominal tenderness.   Musculoskeletal:      Cervical back: Neck supple.      Right knee: Effusion and bony tenderness present. Decreased range of motion. Tenderness present.      Right lower leg: Edema present.      Left lower leg: No edema.      Comments: Patient able to plantarflex dorsiflex right foot  Has palpable pulses   Skin:     General: Skin is warm and dry.      Capillary Refill: Capillary refill takes less than 2 seconds.   Neurological:      General: No focal deficit present.      Mental Status: She is alert and oriented to person, place, and time.   Psychiatric:         Attention and Perception: Attention normal.         Mood and Affect: Mood normal.         Behavior: Behavior is cooperative.          Intake and Output:  Current Shift:  No intake/output data recorded.  Last three shifts:  No intake/output data recorded.    Lab/Data Reviewed:  Recent Results (from the past 24 hour(s))   POCT Glucose    Collection Time: 08/01/22  4:01 PM   Result Value Ref Range    POC Glucose 448 (HH) 65 - 105 mg/dL   POC CHEM 8    Collection Time: 08/01/22  4:10 PM   Result Value Ref Range    Sodium 139 136 - 145 mEq/L    Potassium 4.3 3.5 - 4.9 mEq/L    Chloride 108 (H) 98 - 107 mEq/L    Total CO2 26 21 - 32 mmol/L    Glucose 517 (HH) 74 - 106 mg/dL    BUN 13 7 - 25 mg/dl    Creatinine 0.8 0.6 - 1.3 mg/dl    Hematocrit 36 (L) 38 - 45 %    Hemoglobin 12.2 (L) 12.4 - 17.2 gm/dl    Calcium, Ionized 5.00 4.40 - 5.40 mg/dL   POCT Glucose     Collection Time: 08/01/22  5:27 PM   Result Value Ref Range    POC Glucose 418 (HH) 65 - 105 mg/dL       XR TIBIA FIBULA RIGHT (2 VIEWS)    Result Date: 08/01/2022  Clinical history: Fall EXAMINATION: 2 views right tibia and fibula FINDINGS: There is a comminuted fracture of the medial and lateral tibial plateau. No fracture of the fibula. Lipohemarthrosis.     IMPRESSION: Comminuted medial and lateral tibial plateau fracture. Electronically signed by: Kathlene Cote, MD 08/01/2022 4:09 PM EDT          Workstation ID: [id number]     XR KNEE RIGHT (MIN 4 VIEWS)    Result Date: 08/01/2022  Clinical history: Fall, pain and swelling EXAMINATION: 4 views right knee FINDINGS: There is small joint effusion. No fracture of the distal femur or patella. There is a comminuted fracture of the medial and lateral tibial plateau with angulation. No definitive fracture of the proximal fibula.  IMPRESSION: Comminuted fracture medial and lateral tibial plateau. Electronically signed by: Jolayne PantherSanjay Patel, MD 08/01/2022 4:07 PM EDT          Workstation ID: [id number]             - I independently reviewed the imaging studies done on the patient and agree with interpretation done by the radiologist and reviewed available blood work from this presentation  -I discussed the patient with the emergency room physician about the necessity to admit the patient to the hospital  -RECORDS REVIEWED: I reviewed patient's history previous chart and laboratory results during this admission while making the diagnosis and plan for admission to the hospital- this included but not limited to available notes in Care Everywhere [facility], [facility], outpatient PCP records             Richarda Overlienne Naja Apperson, DO    August 01, 2022  5:46 PM    Dragon medical dictation software was used for portions of this report.  Unintended voice transcription errors may have occurred.

## 2022-08-01 NOTE — Progress Notes (Signed)
S/P fall.  Right LE tibial plateau fx.  Will require ORIF.  Admit.  NPO after midnight.  Surgery tomorrow -- 19 Sept.

## 2022-08-02 ENCOUNTER — Inpatient Hospital Stay: Admit: 2022-08-02 | Payer: PRIVATE HEALTH INSURANCE

## 2022-08-02 LAB — COMPREHENSIVE METABOLIC PANEL
ALT: 18 U/L (ref 10–49)
AST: 15 U/L (ref 0.0–33.9)
Albumin: 3.3 gm/dl — ABNORMAL LOW (ref 3.4–5.0)
Alkaline Phosphatase: 128 U/L — ABNORMAL HIGH (ref 46–116)
Anion Gap: 9 mmol/L (ref 5–15)
BUN: 15 mg/dl (ref 9–23)
CO2: 25 mEq/L (ref 20–31)
Calcium: 9.7 mg/dl (ref 8.7–10.4)
Chloride: 104 mEq/L (ref 98–107)
Creatinine: 1.2 mg/dl — ABNORMAL HIGH (ref 0.55–1.02)
GFR African American: 59
GFR Non-African American: 49
Glucose: 465 mg/dl (ref 74–106)
Potassium: 4.2 mEq/L (ref 3.5–5.1)
Sodium: 138 mEq/L (ref 136–145)
Total Bilirubin: 0.5 mg/dl (ref 0.30–1.20)
Total Protein: 5.9 gm/dl (ref 5.7–8.2)

## 2022-08-02 LAB — POCT GLUCOSE
POC Glucose: 445 mg/dL (ref 65–105)
POC Glucose: 349 mg/dL — ABNORMAL HIGH (ref 65–105)
POC Glucose: 238 mg/dL — ABNORMAL HIGH (ref 65–105)
POC Glucose: 297 mg/dL — ABNORMAL HIGH (ref 65–105)
POC Glucose: 244 mg/dL — ABNORMAL HIGH (ref 65–105)
POC Glucose: 245 mg/dL — ABNORMAL HIGH (ref 65–105)
POC Glucose: 242 mg/dL — ABNORMAL HIGH (ref 65–105)
POC Glucose: 308 mg/dL — ABNORMAL HIGH (ref 65–105)

## 2022-08-02 LAB — PROTIME-INR
INR: 1 (ref 0.1–1.1)
Protime: 11.3 seconds (ref 10.2–12.9)

## 2022-08-02 LAB — CBC WITH AUTO DIFFERENTIAL
Basophils: 0.5 % (ref 0–3)
Eosinophils: 0.1 % (ref 0–5)
Hematocrit: 31.9 % — ABNORMAL LOW (ref 35.0–47.0)
Hemoglobin: 10.2 gm/dl — ABNORMAL LOW (ref 11.0–16.0)
Immature Granulocytes: 0.3 % (ref 0.0–3.0)
Lymphocytes: 13.3 % — ABNORMAL LOW (ref 28–48)
MCH: 27.1 pg (ref 25.4–34.6)
MCHC: 32 gm/dl (ref 30.0–36.0)
MCV: 84.6 fL (ref 80.0–98.0)
MPV: 10.5 fL — ABNORMAL HIGH (ref 6.0–10.0)
Monocytes: 6.3 % (ref 1–13)
Neutrophils Segmented: 79.5 % — ABNORMAL HIGH (ref 34–64)
Nucleated RBCs: 0 (ref 0–0)
Platelets: 246 10*3/uL (ref 140–450)
RBC: 3.77 M/uL (ref 3.60–5.20)
RDW: 40.1 (ref 36.4–46.3)
WBC: 10.5 10*3/uL (ref 4.0–11.0)

## 2022-08-02 LAB — EKG 12-LEAD
Atrial Rate: 84 {beats}/min
Calculated P Axis: 44 degrees
Calculated R Axis: 55 degrees
Calculated T Axis: 18 degrees
DIAGNOSIS, 93000: NORMAL
P-R Interval: 140 ms
Q-T Interval: 390 ms
QRS Duration: 80 ms
QTC Calculation (Bezet): 460 ms
Ventricular Rate: 84 {beats}/min

## 2022-08-02 LAB — ANTIBODY SCREEN: Antibody Screen: NEGATIVE

## 2022-08-02 LAB — ABO/RH: ABO/Rh: O POS

## 2022-08-02 LAB — HEMOGLOBIN A1C: Hemoglobin A1C: 10.1 % — ABNORMAL HIGH (ref 3.8–5.6)

## 2022-08-02 MED ORDER — OXYCODONE HCL 5 MG PO TABS
5 | ORAL | Status: DC | PRN
Start: 2022-08-02 — End: 2022-08-04
  Administered 2022-08-02 – 2022-08-04 (×9): 5 mg via ORAL

## 2022-08-02 MED ORDER — SODIUM CHLORIDE 0.9 % IV SOLN
0.9 % | INTRAVENOUS | Status: AC | PRN
Start: 2022-08-02 — End: ?

## 2022-08-02 MED ORDER — POLYETHYLENE GLYCOL 3350 17 G PO PACK
17 g | Freq: Every day | ORAL | Status: AC | PRN
Start: 2022-08-02 — End: ?

## 2022-08-02 MED ORDER — NORMAL SALINE FLUSH 0.9 % IV SOLN
0.9 % | Freq: Two times a day (BID) | INTRAVENOUS | Status: AC
Start: 2022-08-02 — End: ?
  Administered 2022-08-03 – 2022-08-04 (×4): 10 mL via INTRAVENOUS

## 2022-08-02 MED ORDER — PROPOFOL 200 MG/20ML IV EMUL
200 MG/20ML | INTRAVENOUS | Status: DC | PRN
Start: 2022-08-02 — End: 2022-08-02
  Administered 2022-08-02: 21:00:00 50 via INTRAVENOUS
  Administered 2022-08-02: 18:00:00 200 via INTRAVENOUS

## 2022-08-02 MED ORDER — HYDROMORPHONE HCL 1 MG/ML IJ SOLN
1 MG/ML | INTRAMUSCULAR | Status: DC | PRN
Start: 2022-08-02 — End: 2022-08-02
  Administered 2022-08-02: 21:00:00 1 via INTRAVENOUS

## 2022-08-02 MED ORDER — ONDANSETRON HCL 4 MG/2ML IJ SOLN
4 MG/2ML | Freq: Once | INTRAMUSCULAR | Status: DC | PRN
Start: 2022-08-02 — End: 2022-08-02

## 2022-08-02 MED ORDER — SUCCINYLCHOLINE CHLORIDE 20 MG/ML IJ SOLN
20 MG/ML | INTRAMUSCULAR | Status: DC | PRN
Start: 2022-08-02 — End: 2022-08-02
  Administered 2022-08-02: 18:00:00 100 via INTRAVENOUS

## 2022-08-02 MED ORDER — LACTATED RINGERS IV SOLN
INTRAVENOUS | Status: DC
Start: 2022-08-02 — End: 2022-08-02
  Administered 2022-08-02: 21:00:00 via INTRAVENOUS

## 2022-08-02 MED ORDER — INSULIN REGULAR HUMAN 100 UNIT/ML IJ SOLN
100 UNIT/ML | Freq: Once | INTRAMUSCULAR | Status: DC
Start: 2022-08-02 — End: 2022-08-02

## 2022-08-02 MED ORDER — NORMAL SALINE FLUSH 0.9 % IV SOLN
0.9 % | Freq: Two times a day (BID) | INTRAVENOUS | Status: DC
Start: 2022-08-02 — End: 2022-08-02

## 2022-08-02 MED ORDER — FENTANYL 0.05 MG/ML SOLN (MIXTURES ONLY)
Status: DC | PRN
Start: 2022-08-02 — End: 2022-08-02
  Administered 2022-08-02 (×3): 50 via INTRAVENOUS

## 2022-08-02 MED ORDER — ACETAMINOPHEN 325 MG PO TABS
325 MG | ORAL | Status: AC | PRN
Start: 2022-08-02 — End: ?
  Administered 2022-08-04 (×2): 650 mg via ORAL

## 2022-08-02 MED ORDER — OMEPRAZOLE 20 MG PO CPDR
20 MG | Freq: Every day | ORAL | Status: AC
Start: 2022-08-02 — End: 2022-08-04
  Administered 2022-08-02 – 2022-08-04 (×3): 20 mg via ORAL

## 2022-08-02 MED ORDER — ONDANSETRON HCL 4 MG/2ML IJ SOLN
4 MG/2ML | Freq: Four times a day (QID) | INTRAMUSCULAR | Status: AC | PRN
Start: 2022-08-02 — End: ?
  Administered 2022-08-03: 16:00:00 4 mg via INTRAVENOUS

## 2022-08-02 MED ORDER — KETOROLAC TROMETHAMINE 15 MG/ML IJ SOLN
15 MG/ML | Freq: Once | INTRAMUSCULAR | Status: DC | PRN
Start: 2022-08-02 — End: 2022-08-02

## 2022-08-02 MED ORDER — MORPHINE SULFATE (PF) 2 MG/ML IV SOLN
2 MG/ML | INTRAVENOUS | Status: DC | PRN
Start: 2022-08-02 — End: 2022-08-02
  Administered 2022-08-02: 23:00:00 2 mg via INTRAVENOUS

## 2022-08-02 MED ORDER — CEFAZOLIN SODIUM-DEXTROSE 2-4 GM/100ML-% IV SOLN
2-4 GM/100ML-% | Freq: Three times a day (TID) | INTRAVENOUS | Status: AC
Start: 2022-08-02 — End: 2022-08-03
  Administered 2022-08-02 – 2022-08-03 (×2): 2000 mg via INTRAVENOUS

## 2022-08-02 MED ORDER — IPRATROPIUM-ALBUTEROL 0.5-2.5 (3) MG/3ML IN SOLN
Freq: Once | RESPIRATORY_TRACT | Status: DC | PRN
Start: 2022-08-02 — End: 2022-08-02

## 2022-08-02 MED ORDER — BISACODYL 5 MG PO TBEC
5 MG | Freq: Every day | ORAL | Status: AC | PRN
Start: 2022-08-02 — End: ?

## 2022-08-02 MED ORDER — DIPHENHYDRAMINE HCL 50 MG/ML IJ SOLN
50 MG/ML | INTRAMUSCULAR | Status: DC | PRN
Start: 2022-08-02 — End: 2022-08-02

## 2022-08-02 MED ORDER — MIDAZOLAM HCL 2 MG/2ML IJ SOLN
2 MG/ML | INTRAMUSCULAR | Status: AC
Start: 2022-08-02 — End: ?

## 2022-08-02 MED ORDER — DROPERIDOL 2.5 MG/ML IJ SOLN
2.5 MG/ML | INTRAMUSCULAR | Status: DC | PRN
Start: 2022-08-02 — End: 2022-08-02

## 2022-08-02 MED ORDER — ONDANSETRON HCL 4 MG/2ML IJ SOLN
4 MG/2ML | INTRAMUSCULAR | Status: DC | PRN
Start: 2022-08-02 — End: 2022-08-02
  Administered 2022-08-02: 20:00:00 4 via INTRAVENOUS

## 2022-08-02 MED ORDER — OXYCODONE HCL 5 MG PO TABS
5 MG | ORAL | Status: DC | PRN
Start: 2022-08-02 — End: 2022-08-02

## 2022-08-02 MED ORDER — HYDROMORPHONE HCL 1 MG/ML IJ SOLN
1 MG/ML | INTRAMUSCULAR | Status: DC | PRN
Start: 2022-08-02 — End: 2022-08-02

## 2022-08-02 MED ORDER — LIDOCAINE HCL 1 % IJ SOLN
1 % | INTRAMUSCULAR | Status: DC | PRN
Start: 2022-08-02 — End: 2022-08-02
  Administered 2022-08-02: 18:00:00 50 via INTRAVENOUS

## 2022-08-02 MED ORDER — CEFAZOLIN SODIUM-DEXTROSE 2-4 GM/100ML-% IV SOLN
2-4 GM/100ML-% | INTRAVENOUS | Status: DC
Start: 2022-08-02 — End: 2022-08-02
  Administered 2022-08-02: 18:00:00 2000 mg via INTRAVENOUS

## 2022-08-02 MED ORDER — MEPERIDINE HCL 25 MG/ML IJ SOLN
25 MG/ML | INTRAMUSCULAR | Status: DC | PRN
Start: 2022-08-02 — End: 2022-08-02

## 2022-08-02 MED ORDER — LABETALOL HCL 5 MG/ML IV SOLN
5 MG/ML | INTRAVENOUS | Status: DC | PRN
Start: 2022-08-02 — End: 2022-08-02
  Administered 2022-08-02: 21:00:00 5 via INTRAVENOUS

## 2022-08-02 MED ORDER — FENTANYL CITRATE (PF) 250 MCG/5ML IJ SOLN
250 MCG/5ML | INTRAMUSCULAR | Status: AC
Start: 2022-08-02 — End: ?

## 2022-08-02 MED ORDER — ASPIRIN 325 MG PO TBEC
325 MG | Freq: Two times a day (BID) | ORAL | Status: AC
Start: 2022-08-02 — End: ?
  Administered 2022-08-03 – 2022-08-04 (×4): 325 mg via ORAL

## 2022-08-02 MED ORDER — BUPIVACAINE HCL (PF) 0.5 % IJ SOLN
0.5 % | INTRAMUSCULAR | Status: AC
Start: 2022-08-02 — End: ?

## 2022-08-02 MED ORDER — HYDROMORPHONE HCL 1 MG/ML IJ SOLN
1 MG/ML | INTRAMUSCULAR | Status: AC
Start: 2022-08-02 — End: ?

## 2022-08-02 MED ORDER — MAGNESIUM SULFATE IN D5W 1-5 GM/100ML-% IV SOLN
1-5 GM/100ML-% | INTRAVENOUS | Status: DC | PRN
Start: 2022-08-02 — End: 2022-08-02
  Administered 2022-08-02: 19:00:00 1000 via INTRAVENOUS

## 2022-08-02 MED ORDER — NORMAL SALINE FLUSH 0.9 % IV SOLN
0.9 % | INTRAVENOUS | Status: AC | PRN
Start: 2022-08-02 — End: ?

## 2022-08-02 MED ORDER — ROCURONIUM BROMIDE 50 MG/5ML IV SOLN
50 MG/5ML | INTRAVENOUS | Status: DC | PRN
Start: 2022-08-02 — End: 2022-08-02
  Administered 2022-08-02: 19:00:00 30 via INTRAVENOUS
  Administered 2022-08-02: 18:00:00 40 via INTRAVENOUS
  Administered 2022-08-02: 18:00:00 10 via INTRAVENOUS

## 2022-08-02 MED ORDER — BUPIVACAINE HCL (PF) 0.25 % IJ SOLN
0.25 % | INTRAMUSCULAR | Status: AC
Start: 2022-08-02 — End: 2022-08-02
  Administered 2022-08-02: 17:00:00 30 via PERINEURAL

## 2022-08-02 MED ORDER — ACETAMINOPHEN 10 MG/ML IV SOLN
10 MG/ML | Freq: Three times a day (TID) | INTRAVENOUS | Status: AC
Start: 2022-08-02 — End: 2022-08-03
  Administered 2022-08-02 – 2022-08-03 (×3): 1000 mg via INTRAVENOUS

## 2022-08-02 MED ORDER — HYDROMORPHONE HCL 1 MG/ML IJ SOLN
1 | INTRAMUSCULAR | Status: DC | PRN
Start: 2022-08-02 — End: 2022-08-04
  Administered 2022-08-03 (×2): 0.5 mg via INTRAVENOUS

## 2022-08-02 MED ORDER — FAMOTIDINE (PF) 20 MG/2ML IV SOLN
20 MG/2ML | INTRAVENOUS | Status: DC | PRN
Start: 2022-08-02 — End: 2022-08-02
  Administered 2022-08-02: 18:00:00 20 via INTRAVENOUS

## 2022-08-02 MED ORDER — SENNA-DOCUSATE SODIUM 8.6-50 MG PO TABS
Freq: Two times a day (BID) | ORAL | Status: AC
Start: 2022-08-02 — End: ?
  Administered 2022-08-03 – 2022-08-04 (×4): 1 via ORAL

## 2022-08-02 MED ORDER — MORPHINE SULFATE 4 MG/ML IJ SOLN
4 MG/ML | INTRAMUSCULAR | Status: DC | PRN
Start: 2022-08-02 — End: 2022-08-02
  Administered 2022-08-02 (×3): 4 mg via INTRAVENOUS

## 2022-08-02 MED ORDER — ONDANSETRON 4 MG PO TBDP
4 MG | Freq: Three times a day (TID) | ORAL | Status: AC | PRN
Start: 2022-08-02 — End: ?

## 2022-08-02 MED ORDER — PHENYLEPHRINE HCL 10 MG/ML SOLN (MIXTURES ONLY)
10 MG/ML | Status: DC | PRN
Start: 2022-08-02 — End: 2022-08-02
  Administered 2022-08-02 (×4): 100 via INTRAVENOUS

## 2022-08-02 MED ORDER — LACTATED RINGERS IV SOLN
INTRAVENOUS | Status: DC | PRN
Start: 2022-08-02 — End: 2022-08-02
  Administered 2022-08-02: 18:00:00 via INTRAVENOUS

## 2022-08-02 MED ORDER — HYDRALAZINE HCL 20 MG/ML IJ SOLN
20 MG/ML | INTRAMUSCULAR | Status: DC | PRN
Start: 2022-08-02 — End: 2022-08-02

## 2022-08-02 MED ORDER — MIDAZOLAM HCL 2 MG/2ML IJ SOLN
2 MG/ML | INTRAMUSCULAR | Status: DC | PRN
Start: 2022-08-02 — End: 2022-08-02

## 2022-08-02 MED ORDER — LABETALOL HCL 5 MG/ML IV SOLN
5 MG/ML | INTRAVENOUS | Status: DC | PRN
Start: 2022-08-02 — End: 2022-08-02
  Administered 2022-08-02: 22:00:00 10 mg via INTRAVENOUS

## 2022-08-02 MED ORDER — FENTANYL CITRATE (PF) 100 MCG/2ML IJ SOLN
100 MCG/2ML | INTRAMUSCULAR | Status: DC | PRN
Start: 2022-08-02 — End: 2022-08-02
  Administered 2022-08-02: 22:00:00 25 ug via INTRAVENOUS
  Administered 2022-08-02: 21:00:00 50 ug via INTRAVENOUS
  Administered 2022-08-02: 21:00:00 25 ug via INTRAVENOUS

## 2022-08-02 MED ORDER — SUGAMMADEX SODIUM 200 MG/2ML IV SOLN
200 MG/2ML | INTRAVENOUS | Status: DC | PRN
Start: 2022-08-02 — End: 2022-08-02
  Administered 2022-08-02: 20:00:00 181 via INTRAVENOUS

## 2022-08-02 MED ORDER — INSULIN REGULAR HUMAN 100 UNIT/ML IJ SOLN
100 UNIT/ML | Freq: Once | INTRAMUSCULAR | Status: AC
Start: 2022-08-02 — End: 2022-08-02
  Administered 2022-08-02: 17:00:00 15 [IU] via SUBCUTANEOUS

## 2022-08-02 MED ORDER — OXYCODONE HCL 5 MG PO TABS
5 MG | Freq: Once | ORAL | Status: DC | PRN
Start: 2022-08-02 — End: 2022-08-02

## 2022-08-02 MED ORDER — MIDAZOLAM HCL 2 MG/2ML IJ SOLN
2 MG/ML | INTRAMUSCULAR | Status: AC
Start: 2022-08-02 — End: 2022-08-02
  Administered 2022-08-02: 17:00:00 2 via INTRAVENOUS

## 2022-08-02 MED FILL — MORPHINE SULFATE 2 MG/ML IJ SOLN: 2 mg/mL | INTRAMUSCULAR | Qty: 1

## 2022-08-02 MED FILL — FENTANYL CITRATE (PF) 250 MCG/5ML IJ SOLN: 250 MCG/5ML | INTRAMUSCULAR | Qty: 5

## 2022-08-02 MED FILL — HUMALOG 100 UNIT/ML IJ SOLN: 100 UNIT/ML | INTRAMUSCULAR | Qty: 1

## 2022-08-02 MED FILL — CEFAZOLIN SODIUM-DEXTROSE 2-4 GM/100ML-% IV SOLN: 2-4 GM/100ML-% | INTRAVENOUS | Qty: 100

## 2022-08-02 MED FILL — MORPHINE SULFATE 4 MG/ML IJ SOLN: 4 mg/mL | INTRAMUSCULAR | Qty: 1

## 2022-08-02 MED FILL — VALSARTAN 80 MG PO TABS: 80 MG | ORAL | Qty: 4

## 2022-08-02 MED FILL — SODIUM CHLORIDE 0.9 % IV SOLN: 0.9 % | INTRAVENOUS | Qty: 1000

## 2022-08-02 MED FILL — LABETALOL HCL 5 MG/ML IV SOLN: 5 MG/ML | INTRAVENOUS | Qty: 4

## 2022-08-02 MED FILL — ACETAMINOPHEN 10 MG/ML IV SOLN: 10 MG/ML | INTRAVENOUS | Qty: 100

## 2022-08-02 MED FILL — FENTANYL CITRATE (PF) 100 MCG/2ML IJ SOLN: 100 MCG/2ML | INTRAMUSCULAR | Qty: 2

## 2022-08-02 MED FILL — HYDROMORPHONE HCL 1 MG/ML IJ SOLN: 1 MG/ML | INTRAMUSCULAR | Qty: 1

## 2022-08-02 MED FILL — MIDAZOLAM HCL 2 MG/2ML IJ SOLN: 2 MG/ML | INTRAMUSCULAR | Qty: 2

## 2022-08-02 MED FILL — MARCAINE PRESERVATIVE FREE 0.5 % IJ SOLN: 0.5 % | INTRAMUSCULAR | Qty: 30

## 2022-08-02 MED FILL — LACTATED RINGERS IV SOLN: INTRAVENOUS | Qty: 1000

## 2022-08-02 MED FILL — OMEPRAZOLE 20 MG PO CPDR: 20 MG | ORAL | Qty: 1

## 2022-08-02 MED FILL — OXYCODONE-ACETAMINOPHEN 5-325 MG PO TABS: 5-325 MG | ORAL | Qty: 1

## 2022-08-02 MED FILL — OXYCODONE HCL 5 MG PO TABS: 5 MG | ORAL | Qty: 1

## 2022-08-02 NOTE — Other (Signed)
08/02/22 1807   Family Communication   Contact Person Relationship to Patient Daughter   Contact Person Phone Number Linnell Fulling - daughter   Family/Significant Other Update Called;Updated   Delivery Origin Nurse   Update Given Yes   Family Communication   Family Update Message Patient stable

## 2022-08-02 NOTE — Other (Signed)
Dr Tempie Donning made aware of blood sugar, order given and placed in computer

## 2022-08-02 NOTE — H&P (Signed)
[address]  [place], [place] [address]  Office: [phone]    Orthopaedic Consult Note    Patient: Michelle Herring               Sex: female          DOA: 08/01/2022       Date of Birth:  [birth year]      Age:  56 y.o.        LOS:  LOS: 1 day        CC:  Right tibial plateau fracture, status post fall.    HPI:  56 year old female, status post fall.  Presented to the emergency room yesterday, with severe right lower extremity pain.     PAST MEDICAL HISTORY:  Past Medical History:   Diagnosis Date    Diabetes mellitus, type II, insulin dependent (HCC)     Dyslipidemia     HTN (hypertension)     Obesity     OSA on CPAP          PAST SURGICAL HISTORY:    Past Surgical History:   Procedure Laterality Date    APPENDECTOMY  81    CESAREAN SECTION  1984; 1986; 1989    HYSTERECTOMY (CERVIX STATUS UNKNOWN)  1998         FAMILY HISTORY:  Family History   Problem Relation Age of Onset    Diabetes Mother     Cancer Father     Diabetes Father     Hypertension Father     Stroke Paternal Grandmother          SOCIAL HISTORY:  Social History     Socioeconomic History    Marital status: Legally Separated   Tobacco Use    Smoking status: Never    Smokeless tobacco: Never   Substance and Sexual Activity    Alcohol use: Yes    Drug use: No         REVIEW OF SYSTEMS:  See chart.    PHYSICAL EXAMINATION:    Grossly intact, right lower extremity.  No obvious skin lesions.  Diffuse pain and swelling, right knee.  Point tenderness, right proximal tibia.    Exam for E/M Coding:  GENERAL: well developed, well nourished.  EYES: lids and conjunctiva are normal  E/N/T: Normal external ears and nose;  NECK: neck supple  CARDIOVASCULAR: No edema  MUSCULOSKELETAL: digits/nails: no clubbing, cyanosis, or evidence of ischemia or infection;   SKIN: no ulcerations, lesions or rashes  NEUROLOGIC: sensation, grossly normal;  PSYCHIATRIC: Mental status:alert and oriented x3; appropriate affect and demeanor; good insight and  judgement      Procedures/imaging: see electronic medical records for all procedures, Xrays and details which were not copied into this note but were reviewed.     Clinical history: Fall, pain and swelling     EXAMINATION: 4 views right knee     FINDINGS:     There is small joint effusion. No fracture of the distal femur or patella. There  is a comminuted fracture of the medial and lateral tibial plateau with  angulation. No definitive fracture of the proximal fibula.     IMPRESSION:  IMPRESSION:  Comminuted fracture medial and lateral tibial plateau.    Allergies:  No Known Allergies    Home Medications:  Prior to Admission Medications   Prescriptions Last Dose Informant Patient Reported? Taking?   NOVOLOG RELION 100 UNIT/ML injection vial 08/01/2022  Yes No   Sig: USE UP TO 80 UNITS  DAILY VIA INSULIN PUMP   omeprazole (PRILOSEC) 10 MG delayed release capsule 07/31/2022  Yes Yes   Sig: Take 1 capsule by mouth daily   valsartan-hydroCHLOROthiazide (DIOVAN-HCT) 320-12.5 MG per tablet 08/01/2022  Yes No   Sig: Take 1 tablet by mouth daily      Facility-Administered Medications: None         Chart and notes reviewed. Data reviewed. I have evaluated and examined the patient.                IMPRESSION:  Closed right tibial plateau fracture, medial and lateral plateaus.     DISCUSSION:  Patient will be admitted for pain control, IV hydration, etc.  Medical consultation has been obtained.  Risk and benefits of surgical management discussed.  We will proceed with open reduction internal fixation.  NPO.  Perioperative antibiotics.       Myra Rude, MD   08/02/22

## 2022-08-02 NOTE — Op Note (Signed)
Operative Note      Patient: Michelle Herring  Date of Birth: 01-05-1966  MRN: [id number]    Date of Procedure: Aug 14, 2022    Pre-Op Diagnosis Codes:     * History of open reduction and internal fixation (ORIF) procedure [Z98.890]    Post-Op Diagnosis: Same       Procedure(s):  OPEN REDUCTION INTERNAL FIXATION TIBIAL PLATEAU, bicondylar fracture with comminution, using Synthes locking periarticular plates, both medial and lateral.  Use of allograft, nonstructural.  Vivigen 10 cc.    Surgeon(s):  Arita Miss, MD    Assistant:   Physician Assistant: Jamelle Haring, PA    Anesthesia: General    Estimated Blood Loss (mL): 300     Complications: None    Specimens:   * No specimens in log *    Implants:  Implant Name Type Inv. Item Serial No. Manufacturer Lot No. LRB No. Used Action   SCREW CORTEX SELF-TAP 3.5 X - [id number] Screw/Plate/Nail/Rod SCREW CORTEX SELF-TAP 3.5 X  [employer] ASIF_CR N/A Right 1 Implanted   SCREW CORTEX SELF-TAP 3.5 X - [id number] Screw/Plate/Nail/Rod SCREW CORTEX SELF-TAP 3.5 X  [employer] ASIF_CR N/A Right 1 Implanted   BONE CELLULAR MATRIX VIVIGEN FORMABLE LARGE - [id number] Bone/Graft/Tissue/Human/Synth BONE CELLULAR MATRIX VIVIGEN FORMABLE LARGE [id number] LIFENET_CR  Right 1 Implanted   SCREW CORTEX SELF-TAP 3.5 X - [id number] Screw/Plate/Nail/Rod SCREW CORTEX SELF-TAP 3.5 X  [employer] ASIF_CR N/A Right 1 Implanted   SCREW LOCKING VA SELF-TAP 3.5X65MM - [id number] Screw/Plate/Nail/Rod SCREW LOCKING VA SELF-TAP 3.5X65MM  [employer] ASIF_CR N/A Right 1 Implanted   SCREW CONICAL SELF-TAP 3.5MMX70MM - [id number] Screw/Plate/Nail/Rod SCREW CONICAL SELF-TAP 3.5MMX70MM  [employer] ASIF_CR N/A Right 1 Implanted   SCREW CONICAL SELF-TAP 3.5MMX80MM - [id number] Screw/Plate/Nail/Rod SCREW CONICAL SELF-TAP 3.5MMX80MM  [employer] ASIF_CR N/A Right 1 Implanted   PLATE TIBIAL PROX MEDIAL 3.5 X 4H RIGHT - [id number] Plate PLATE TIBIAL PROX MEDIAL 3.5 X 4H  RIGHT  [employer] ASIF_CR N/A Right 1 Implanted   PLATE TIBIAL PROX SMALL BEND 4H/87MM RT - [id number] Plate PLATE TIBIAL PROX SMALL BEND 4H/87MM RT  [employer] ASIF_CR N/A Right 1 Implanted   SCREW LOCKING VA SELF-TAP 3.5X60MM - [id number] Screw/Plate/Nail/Rod SCREW LOCKING VA SELF-TAP 3.5X60MM  [employer] ASIF_CR N/A Right 4 Implanted   SCREW LOCKING VA SELF-TAP 3.5X28MM - [id number] Screw/Plate/Nail/Rod SCREW LOCKING VA SELF-TAP 3.5X28MM  [employer] ASIF_CR N/A Right 1 Implanted   SCREW LOCKING VA SELF-TAP 3.5X56MM - [id number] Screw/Plate/Nail/Rod SCREW LOCKING VA SELF-TAP 3.5X56MM  [employer] ASIF_CR N/A Right 1 Implanted   SCREW CONICAL SELF-TAP 3.5MMX75MM - [id number] Screw/Plate/Nail/Rod SCREW CONICAL SELF-TAP [id number]  [employer] ASIF_CR N/A Right 1 Implanted   SCREW LOCKING VA SELF-TAP 3.5X40MM - [id number] Screw/Plate/Nail/Rod SCREW LOCKING VA SELF-TAP 3.5X40MM  [employer] ASIF_CR N/A Right 1 Implanted         Drains: None    Findings: Comminuted bicondylar tibial plateau fracture.        Detailed Description of Procedure:   Patient is a 56 year old female, status post fall, who presented to me with a displaced comminuted bicondylar tibial plateau fracture.  After discussing the risk and benefits of surgical management she agreed to proceed with surgery.  On 01-Oct-2024the patient was identified properly in the SAU area and brought to the operating room.  A general anesthetic was performed by the anesthesiologist.  The patient was placed supine on the operating  table.  Patient's right lower extremity was prepped and draped with ChloraPrep.  An incision was made over the medial plateau.  Dissection was carried out down to the fracture site.  Periosteal flaps were created.  Neurovascular planes were utilized.  The fracture was reduced to an anatomic position.  A periarticular plate was transfixed using periarticular locking screws, nonlocking screws, etc.  In a similar fashion, the lateral  plateau was exposed.  The fracture was reduced to an anatomic position.  A periarticular plate was transfixed to the proximal tibia in the usual fashion.  Final radiographs were obtained in AP and lateral projection.  Wounds were then irrigated.  Wounds were closed in layers.  Staples used for skin.  Bone graft was placed along the fracture site, posteriorly through the medial wound.  Staples were used for skin.  Sterile dressings were applied.  The patient was extubated and taken to the recovery room without complications.    Electronically signed by Myra Rude, MD on 08/02/2022 at 4:49 PM

## 2022-08-02 NOTE — Anesthesia Pre-Procedure Evaluation (Signed)
Department of Anesthesiology  Preprocedure Note       Name:  Michelle Herring   Age:  56 y.o.  DOB:  03/07/1966                                          MRN:  [id number]         Date:  08/02/2022      Surgeon: Moishe Spice):  Arita Miss, MD    Procedure: Procedure(s):  OPEN REDUCTION INTERNAL FIXATION TIBIAL PLATEAU    Medications prior to admission:   Prior to Admission medications    Medication Sig Start Date End Date Taking? Authorizing Provider   citalopram (CELEXA) 20 MG tablet Take 1 tablet by mouth daily   Yes Historical Provider, MD   omeprazole (PRILOSEC) 10 MG delayed release capsule Take 1 capsule by mouth daily   Yes Historical Provider, MD   NOVOLOG RELION 100 UNIT/ML injection vial USE UP TO 80 UNITS DAILY VIA INSULIN PUMP 06/30/22   Historical Provider, MD   valsartan-hydroCHLOROthiazide (DIOVAN-HCT) 320-12.5 MG per tablet Take 1 tablet by mouth daily 05/07/22   Historical Provider, MD       Current medications:    Current Facility-Administered Medications   Medication Dose Route Frequency Provider Last Rate Last Admin   . morphine (PF) injection 2 mg  2 mg IntraVENous Q4H PRN Ruchita Simoes, MD       . acetaminophen (OFIRMEV) infusion 1,000 mg  1,000 mg IntraVENous q8h Tia Masker, MD   Stopped at 08/02/22 1107   . oxyCODONE (ROXICODONE) immediate release tablet 5 mg  5 mg Oral Q4H PRN Ruchita Simoes, MD   5 mg at 08/02/22 0848   . 0.9 % sodium chloride infusion   IntraVENous Continuous Arita Miss, MD 100 mL/hr at 08/02/22 0851 New Bag at 08/02/22 0851   . dextrose 50 % IV solution  10-50 mL IntraVENous PRN Richarda Overlie, DO       . glucagon (rDNA) injection 1 mg  1 mg IntraMUSCular PRN Richarda Overlie, DO       . insulin glargine (LANTUS) injection vial 1-100 Units  1-100 Units SubCUTAneous QHS Richarda Overlie, DO   23 Units at 08/01/22 2144   . insulin lispro (HUMALOG) injection vial 1-100 Units  1-100 Units SubCUTAneous 4x Daily AC & HS Richarda Overlie, DO   5 Units at 08/02/22 0844   . insulin  lispro (HUMALOG) injection vial 1-100 Units  1-100 Units SubCUTAneous PRN Anne Hutchinson, DO       . valsartan (DIOVAN) tablet 320 mg  320 mg Oral Daily Arita Miss, MD   320 mg at 08/02/22 0847    And   . [Held by provider] hydroCHLOROthiazide (HYDRODIURIL) tablet 12.5 mg  12.5 mg Oral Daily Arita Miss, MD   12.5 mg at 08/01/22 1843   . senna (SENOKOT) tablet 8.6 mg  1 tablet Oral Nightly Anne Hutchinson, DO       . polyethylene glycol (GLYCOLAX) packet 17 g  17 g Oral Daily Anne Hutchinson, DO       . omeprazole (PRILOSEC) delayed release capsule 20 mg  20 mg Oral Daily Anne Hutchinson, DO   20 mg at 08/01/22 2145       Allergies:  No Known Allergies    Problem List:    Patient Active Problem List   Diagnosis Code   .  Dyslipidemia E78.5   . Obesity E66.9   . HTN (hypertension) I10   . OSA on CPAP G47.33, Z99.89   . Diabetes mellitus, type II, insulin dependent (HCC) E11.9, Z79.4   . Tibial plateau fracture, right, closed, initial encounter S82.141A       Past Medical History:        Diagnosis Date   . Diabetes mellitus, type II, insulin dependent ([place])    . Dyslipidemia    . HTN (hypertension)    . Obesity    . OSA on CPAP        Past Surgical History:        Procedure Laterality Date   . APPENDECTOMY  1984   . CESAREAN SECTION  1984; 1986; 1989   . HYSTERECTOMY (CERVIX STATUS UNKNOWN)  1998   . ORTHOPEDIC SURGERY Left     left big toe amputated   . SLEEVE GASTRECTOMY N/A        Social History:    Social History     Tobacco Use   . Smoking status: Never   . Smokeless tobacco: Never   Substance Use Topics   . Alcohol use: Yes                                Counseling given: Not Answered      Vital Signs (Current):   Vitals:    08/02/22 0757 08/02/22 0800 08/02/22 1107 08/02/22 1157   BP: (!) 162/75  (!) 157/72 (!) 140/71   Pulse: 77 77 81 85   Resp: 20  18 18    Temp: 97.5 F (36.4 C)  97 F (36.1 C) 98.2 F (36.8 C)   TempSrc: Temporal  Temporal Oral   SpO2: 97%  94%    Weight:       Height:                                                   BP Readings from Last 3 Encounters:   08/02/22 (!) 140/71       NPO Status:                                                                                 BMI:   Wt Readings from Last 3 Encounters:   08/01/22 200 lb (90.7 kg)     Body mass index is 35.43 kg/m.    CBC:   Lab Results   Component Value Date/Time    WBC 10.5 08/01/2022 09:27 PM    RBC 3.77 08/01/2022 09:27 PM    HGB 10.2 08/01/2022 09:27 PM    HCT 31.9 08/01/2022 09:27 PM    MCV 84.6 08/01/2022 09:27 PM    RDW 40.1 08/01/2022 09:27 PM    PLT 246 08/01/2022 09:27 PM       CMP:   Lab Results   Component Value Date/Time    NA 138 08/01/2022 09:27 PM    K 4.2 08/01/2022 09:27 PM  CL 104 08/01/2022 09:27 PM    CO2 25 08/01/2022 09:27 PM    CO2 29 09/29/2021 09:16 PM    BUN 15 08/01/2022 09:27 PM    CREATININE 1.20 08/01/2022 09:27 PM    GFRAA 59.0 08/01/2022 09:27 PM    LABGLOM 49 08/01/2022 09:27 PM    GLUCOSE 465 08/01/2022 09:27 PM    PROT 7.5 09/29/2021 09:06 PM    CALCIUM 9.7 08/01/2022 09:27 PM    BILITOT 0.50 08/01/2022 09:27 PM    ALKPHOS 128 08/01/2022 09:27 PM    AST 15.0 08/01/2022 09:27 PM    ALT 18 08/01/2022 09:27 PM       POC Tests:   Recent Labs     08/02/22  1109   POCGLU 244*       Coags:   Lab Results   Component Value Date/Time    PROTIME 11.3 08/01/2022 09:27 PM    INR 1.0 08/01/2022 09:27 PM       HCG (If Applicable): No results found for: "PREGTESTUR", "PREGSERUM", "HCG", "HCGQUANT"     ABGs: No results found for: "PHART", "PO2ART", "PCO2ART", "HCO3ART", "BEART", "O2SATART"     Type & Screen (If Applicable):  No results found for: "LABABO", "LABRH"    Drug/Infectious Status (If Applicable):  No results found for: "HIV", "HEPCAB"    COVID-19 Screening (If Applicable): No results found for: "COVID19"        Anesthesia Evaluation  Patient summary reviewed and Nursing notes reviewed  Airway: Mallampati: II  TM distance: >3 FB   Neck ROM: full  Mouth opening: > = 3 FB   Dental: normal exam          Pulmonary:normal exam    (+) sleep apnea: on noncompliant,                             Cardiovascular:    (+) hypertension:,                   Neuro/Psych:   Negative Neuro/Psych ROS              GI/Hepatic/Renal: Neg GI/Hepatic/Renal ROS            Endo/Other:    (+) Diabetes, .                 Abdominal:             Vascular: negative vascular ROS.         Other Findings:           Anesthesia Plan      general and regional     ASA 2       Induction: intravenous.      Anesthetic plan and risks discussed with patient.    Use of blood products discussed with patient whom.     Attending anesthesiologist reviewed and agrees with Preprocedure content                Einar Crow, MD   08/02/2022

## 2022-08-02 NOTE — Plan of Care (Signed)
Problem: Discharge Planning  Goal: Discharge to home or other facility with appropriate resources  Outcome: Progressing  Flowsheets (Taken 08/02/2022 0800)  Discharge to home or other facility with appropriate resources:   Identify barriers to discharge with patient and caregiver   Identify discharge learning needs (meds, wound care, etc)

## 2022-08-02 NOTE — Progress Notes (Signed)
INTERNAL MEDICINE PROGRESS NOTE    Date of note:      August 02, 2022    Patient:               Michelle Herring, 56 y.o., female  Admit Date:        08/01/2022  Length of Stay:  1 day(s)    Problem List:   Patient Active Problem List   Diagnosis    Dyslipidemia    Obesity    HTN (hypertension)    OSA on CPAP    Diabetes mellitus, type II, insulin dependent ([place])    Tibial plateau fracture, right, closed, initial encounter       Subjective + interval history     Doing ok except for pain.      Assessment :        Comminuted fracture medial/lateral right tibial plateau, s/p mechanical fall   IDDM 1 uncontrolled with hyperglycemia  Hypertension  OSA - usesCPAP at home  Hx melanoma s/p amputation left great toe  Obesity BMI 35  History of gastric sleeve surgery    Plan :     Hold off insulin pump. Continue insulin per glucommander. Hypoglycemia protocol as needed.  Blood sugars are elevated.  Will get diabetes educator consult to make sure her insulin pump is working properly  Check A1c    Patient noted that she is just visiting her daughter here. She does not have her CPAP machine here. Will need ETCO2 monitoring. BiPAP if needed.    BP elevated likely from pain.   Pain meds ordered -around-the-clock IV Tylenol, oxycodone 5 mg every 4 hours as needed for moderate pain, morphine IV 2 mg every 4 hours as needed for severe pain.    Creatinine up to 1.2 -> hold off HCTZ  Patient is NPO.  Continue IV fluids 100 mill per hour.        DVT ppx : Start Lovenox after surgery        Objective:     Visit Vitals  BP (!) 162/75   Pulse 77   Temp 97.5 F (36.4 C) (Temporal)   Resp 20   Ht 5\' 3"  (1.6 m)   Wt 200 lb (90.7 kg)   SpO2 97%   BMI 35.43 kg/m              No intake or output data in the 24 hours ending 08/02/22 2440    Physical Exam:     GEN - AAOx3, NAD  HEENT mucous membranes moist  Neck - supple, no JVD  Cardiac - RRR, S1, S2, no murmurs  Chest/Lungs - clear to auscultation without wheezes or rhonchi  Abdomen -  soft, nontender, nondistended, normal bowel sounds  Extremities - no clubbing/ cyanosis/ edema/right leg is immobilized -right toes and foot warm  Neuro -follows commands      Current medications:       Current Facility-Administered Medications:     0.9 % sodium chloride infusion, , IntraVENous, Continuous, Myra Rude, MD, Last Rate: 100 mL/hr at 08/01/22 1701, New Bag at 08/01/22 1701    oxyCODONE-acetaminophen (PERCOCET) 5-325 MG per tablet 1 tablet, 1 tablet, Oral, Q4H PRN, Myra Rude, MD, 1 tablet at 08/01/22 1725    dextrose 50 % IV solution, 10-50 mL, IntraVENous, PRN, Anne Hutchinson, DO    glucagon (rDNA) injection 1 mg, 1 mg, IntraMUSCular, PRN, Anne Hutchinson, DO    insulin glargine (LANTUS) injection vial 1-100 Units, 1-100 Units,  SubCUTAneous, QHS, Richarda Overlie, DO, 23 Units at 08/01/22 2144    insulin lispro (HUMALOG) injection vial 1-100 Units, 1-100 Units, SubCUTAneous, 4x Daily AC & HS, Richarda Overlie, DO, 20 Units at 08/01/22 2144    insulin lispro (HUMALOG) injection vial 1-100 Units, 1-100 Units, SubCUTAneous, PRN, Richarda Overlie, DO    valsartan (DIOVAN) tablet 320 mg, 320 mg, Oral, Daily, 320 mg at 08/01/22 1843 **AND** hydroCHLOROthiazide (HYDRODIURIL) tablet 12.5 mg, 12.5 mg, Oral, Daily, Arita Miss, MD, 12.5 mg at 08/01/22 1843    senna (SENOKOT) tablet 8.6 mg, 1 tablet, Oral, Nightly, Anne Hutchinson, DO    polyethylene glycol (GLYCOLAX) packet 17 g, 17 g, Oral, Daily, Anne Hutchinson, DO    omeprazole (PRILOSEC) delayed release capsule 20 mg, 20 mg, Oral, Daily, Anne Hutchinson, DO, 20 mg at 08/01/22 2145    morphine injection 4 mg, 4 mg, IntraVENous, Q4H PRN, Richarda Overlie, DO, 4 mg at 08/02/22 0517    Labs:     Recent Results (from the past 24 hour(s))   POCT Glucose    Collection Time: 08/01/22  4:01 PM   Result Value Ref Range    POC Glucose 448 (HH) 65 - 105 mg/dL   POC CHEM 8    Collection Time: 08/01/22  4:10 PM   Result Value Ref Range    Sodium 139 136 - 145 mEq/L     Potassium 4.3 3.5 - 4.9 mEq/L    Chloride 108 (H) 98 - 107 mEq/L    Total CO2 26 21 - 32 mmol/L    Glucose 517 (HH) 74 - 106 mg/dL    BUN 13 7 - 25 mg/dl    Creatinine 0.8 0.6 - 1.3 mg/dl    Hematocrit 36 (L) 38 - 45 %    Hemoglobin 12.2 (L) 12.4 - 17.2 gm/dl    Calcium, Ionized 1.76 4.40 - 5.40 mg/dL   POCT Glucose    Collection Time: 08/01/22  5:27 PM   Result Value Ref Range    POC Glucose 418 (HH) 65 - 105 mg/dL   POCT Glucose    Collection Time: 08/01/22  7:10 PM   Result Value Ref Range    POC Glucose 409 (HH) 65 - 105 mg/dL   POCT Glucose    Collection Time: 08/01/22  9:01 PM   Result Value Ref Range    POC Glucose 445 (HH) 65 - 105 mg/dL   CBC with Auto Differential    Collection Time: 08/01/22  9:27 PM   Result Value Ref Range    WBC 10.5 4.0 - 11.0 1000/mm3    RBC 3.77 3.60 - 5.20 M/uL    Hemoglobin 10.2 (L) 11.0 - 16.0 gm/dl    Hematocrit 16.0 (L) 35.0 - 47.0 %    MCV 84.6 80.0 - 98.0 fL    MCH 27.1 25.4 - 34.6 pg    MCHC 32.0 30.0 - 36.0 gm/dl    Platelets 737 106 - 450 1000/mm3    MPV 10.5 (H) 6.0 - 10.0 fL    RDW 40.1 36.4 - 46.3      Nucleated RBCs 0 0 - 0      Immature Granulocytes 0.3 0.0 - 3.0 %    Neutrophils Segmented 79.5 (H) 34 - 64 %    Lymphocytes 13.3 (L) 28 - 48 %    Monocytes 6.3 1 - 13 %    Eosinophils 0.1 0 - 5 %    Basophils 0.5 0 - 3 %  Comprehensive Metabolic Panel    Collection Time: 08/01/22  9:27 PM   Result Value Ref Range    Potassium 4.2 3.5 - 5.1 mEq/L    Chloride 104 98 - 107 mEq/L    Sodium 138 136 - 145 mEq/L    CO2 25 20 - 31 mEq/L    Glucose 465 (HH) 74 - 106 mg/dl    BUN 15 9 - 23 mg/dl    Creatinine 1.10 (H) 0.55 - 1.02 mg/dl    GFR African American 59.0      GFR Non-African American 49      Calcium 9.7 8.7 - 10.4 mg/dl    Anion Gap 9 5 - 15 mmol/L    AST 15.0 0.0 - 33.9 U/L    ALT 18 10 - 49 U/L    Alkaline Phosphatase 128 (H) 46 - 116 U/L    Total Bilirubin 0.50 0.30 - 1.20 mg/dl    Total Protein 5.9 5.7 - 8.2 gm/dl    Albumin 3.3 (L) 3.4 - 5.0 gm/dl   ABO/RH     Collection Time: 08/01/22  9:27 PM   Result Value Ref Range    ABO/Rh O Rh Positive     ANTIBODY SCREEN    Collection Time: 08/01/22  9:27 PM   Result Value Ref Range    Antibody Screen NEG     Protime-INR    Collection Time: 08/01/22  9:27 PM   Result Value Ref Range    Protime 11.3 10.2 - 12.9 seconds    INR 1.0 0.1 - 1.1     POCT Glucose    Collection Time: 08/01/22 10:55 PM   Result Value Ref Range    POC Glucose 349 (H) 65 - 105 mg/dL   POCT Glucose    Collection Time: 08/02/22  5:05 AM   Result Value Ref Range    POC Glucose 238 (H) 65 - 105 mg/dL   POCT Glucose    Collection Time: 08/02/22  6:50 AM   Result Value Ref Range    POC Glucose 297 (H) 65 - 105 mg/dL       RAD Results:  XR CHEST PORTABLE    Result Date: 08/01/2022  Clinical history: Tibial fracture, preop EXAMINATION: AP view of the chest 08/01/2022 Correlation: None FINDINGS: Trachea and heart size are within normal limits. Lungs are clear.     IMPRESSION: No acute pulmonary process. Electronically signed by: Jolayne Panther, MD 08/01/2022 6:05 PM EDT          Workstation ID: [id number]     XR TIBIA FIBULA RIGHT (2 VIEWS)    Result Date: 08/01/2022  Clinical history: Fall EXAMINATION: 2 views right tibia and fibula FINDINGS: There is a comminuted fracture of the medial and lateral tibial plateau. No fracture of the fibula. Lipohemarthrosis.     IMPRESSION: Comminuted medial and lateral tibial plateau fracture. Electronically signed by: Jolayne Panther, MD 08/01/2022 4:09 PM EDT          Workstation ID: [id number]     XR KNEE RIGHT (MIN 4 VIEWS)    Result Date: 08/01/2022  Clinical history: Fall, pain and swelling EXAMINATION: 4 views right knee FINDINGS: There is small joint effusion. No fracture of the distal femur or patella. There is a comminuted fracture of the medial and lateral tibial plateau with angulation. No definitive fracture of the proximal fibula.     IMPRESSION: Comminuted fracture medial and lateral tibial plateau. Electronically signed by:  Jolayne Panther, MD 08/01/2022 4:07  PM EDT          Workstation ID: [id number]         Total clinical care time was 45  minutes of which more than 50% was spent in coordination of care and counseling (time spent with patient/family face to face, physical exam, reviewing laboratory and imaging investigations, speaking with physicians and nursing staff involved in this patient's care).     Dragon medical dictation system was used for part of this note. Unintentional voice recognition errors may occur    Tia MaskerUCHITA Rin Gorton,  MD  [facility]  [facility]  August 02, 2022  Time: 8:12 AM

## 2022-08-02 NOTE — Anesthesia Procedure Notes (Signed)
Peripheral Block    Patient location during procedure: pre-op  Reason for block: post-op pain management and at surgeon's request  Start time: 08/02/2022 1:22 PM  End time: 08/02/2022 1:28 PM  Staffing  Performed: anesthesiologist   Anesthesiologist: Coy Saunas, MD  Performed by: Coy Saunas, MD  Authorized by: Coy Saunas, MD    Preanesthetic Checklist  Completed: patient identified, IV checked, site marked, risks and benefits discussed, surgical/procedural consents, equipment checked, pre-op evaluation, timeout performed, anesthesia consent given, oxygen available, monitors applied/VS acknowledged, fire risk safety assessment completed and verbalized and blood product R/B/A discussed and consented  Peripheral Block   Patient position: supine  Prep: ChloraPrep  Patient monitoring: continuous pulse ox and cardiac monitor  Block type: Femoral  Adductor canal  Laterality: right  Injection technique: single-shot  Guidance: nerve stimulator and ultrasound guided    Needle   Needle type: insulated echogenic nerve stimulator needle   Needle gauge: 22 G  Needle localization: nerve stimulator and ultrasound guidance  Needle length: 10 cm  Assessment   Injection assessment: negative aspiration for heme, no paresthesia on injection, local visualized surrounding nerve on ultrasound and no intravascular symptoms  Paresthesia pain: none  Slow fractionated injection: yes  Hemodynamics: stable  Real-time [place] image taken/store: yes  Outcomes: patient tolerated procedure well and uncomplicated    Medications Administered  bupivacaine (MARCAINE) PF injection 0.25% - Perineural   30 mL - 08/02/2022 1:26:00 PM  midazolam (VERSED) injection 2 mg/13mL - IntraVENous   2 mg - 08/02/2022 1:21:00 PM

## 2022-08-02 NOTE — Other (Signed)
Patient blood sugar rechecked in computer patient aware

## 2022-08-02 NOTE — Other (Signed)
TRANSFER - OUT REPORT:    Verbal report given to Isatu RN on [employer]  being transferred to 2E for routine post-op       Report consisted of patient's Situation, Background, Assessment and   Recommendations(SBAR).     Information from the following report(s) Nurse Handoff Report, Surgery Report, MAR, and Recent Results was reviewed with the receiving nurse.           Lines:   Peripheral IV 08/01/22 Right Antecubital (Active)   Site Assessment Clean, dry & intact 08/02/22 1651   Line Status Infusing 08/02/22 1651   Line Care Connections checked and tightened 08/02/22 1651   Phlebitis Assessment No symptoms 08/02/22 1651   Infiltration Assessment 0 08/02/22 1651   Alcohol Cap Used Yes 08/02/22 1651   Dressing Status Clean, dry & intact 08/02/22 1651   Dressing Type Transparent 08/02/22 1651        Opportunity for questions and clarification was provided.      Patient transported with:  O2 @ 3lpm and Tech

## 2022-08-02 NOTE — Care Coordination-Inpatient (Signed)
08/02/22 0935   Service Assessment   Patient Orientation Alert and Oriented;Person;Place;Situation;Self   Cognition Alert   History Provided By Patient   Primary Caregiver Self   Accompanied By/Relationship None   Support Systems Children   PCP Verified by CM Yes  Heide Scales PA ([place] PCP))   Prior Functional Level Independent in ADLs/IADLs   Current Functional Level Assistance with the following:;Mobility   Can patient return to prior living arrangement Yes   Ability to make needs known: Good   Family able to assist with home care needs: Yes   Would you like for me to discuss the discharge plan with any other family members/significant others, and if so, who? Yes  (Daughter Linnell Fulling [phone])   Social/Functional History   Lives With Daughter  (Resides with Daughter post op in [place])   [place] to enter without rails   Entrance Stairs - Number of Steps 2   Receives Help From Family   ADL Assistance Needs assistance   Toileting Needs assistance   [place] assistance   Ambulation Assistance Needs assistance   Transfer Assistance Needs assistance   Discharge Planning   Type of [place] Family Members   Potential Assistance Needed Durable Medical Equipment   Potential DME Needed Bedside Commode;Walker   Patient expects to be discharged to: [facility]   Transition of Care Consult (CM Consult) Discharge Planning;Home Health;Transportation Assistance   Mode of Transport at Discharge Other (see comment)  (Family vs Medical transport)   Confirm Follow Up Transport Family   Condition of Participation: Discharge Planning   The Plan for Transition of Care is related to the following treatment goals: Return to PLOF/ IADLS   The Patient and/or Patient Representative was provided with a Choice of Provider? Patient   The Patient and/Or Patient Representative agree with the Discharge Plan? Yes   Freedom of Choice  list was provided with basic dialogue that supports the patient's individualized plan of care/goals, treatment preferences, and shares the quality data associated with the providers?  Yes

## 2022-08-03 LAB — CBC WITH AUTO DIFFERENTIAL
Basophils: 0.3 % (ref 0–3)
Eosinophils: 0.3 % (ref 0–5)
Hematocrit: 28.9 % — ABNORMAL LOW (ref 35.0–47.0)
Hemoglobin: 9.2 gm/dl — ABNORMAL LOW (ref 11.0–16.0)
Immature Granulocytes: 0.4 % (ref 0.0–3.0)
Lymphocytes: 14.9 % — ABNORMAL LOW (ref 28–48)
MCH: 27.3 pg (ref 25.4–34.6)
MCHC: 31.8 gm/dl (ref 30.0–36.0)
MCV: 85.8 fL (ref 80.0–98.0)
MPV: 10.3 fL — ABNORMAL HIGH (ref 6.0–10.0)
Monocytes: 8.1 % (ref 1–13)
Neutrophils Segmented: 76 % — ABNORMAL HIGH (ref 34–64)
Nucleated RBCs: 0 (ref 0–0)
Platelets: 233 10*3/uL (ref 140–450)
RBC: 3.37 M/uL — ABNORMAL LOW (ref 3.60–5.20)
RDW: 41.2 (ref 36.4–46.3)
WBC: 10 10*3/uL (ref 4.0–11.0)

## 2022-08-03 LAB — BASIC METABOLIC PANEL
Anion Gap: 7 mmol/L (ref 5–15)
BUN: 11 mg/dl (ref 9–23)
CO2: 25 mEq/L (ref 20–31)
Calcium: 9.2 mg/dl (ref 8.7–10.4)
Chloride: 105 mEq/L (ref 98–107)
Creatinine: 0.93 mg/dl (ref 0.55–1.02)
GFR African American: >60
GFR Non-African American: >60
Glucose: 320 mg/dl — ABNORMAL HIGH (ref 74–106)
Potassium: 4.2 mEq/L (ref 3.5–5.1)
Sodium: 137 mEq/L (ref 136–145)

## 2022-08-03 LAB — POCT GLUCOSE
POC Glucose: 331 mg/dL — ABNORMAL HIGH (ref 65–105)
POC Glucose: 276 mg/dL — ABNORMAL HIGH (ref 65–105)
POC Glucose: 193 mg/dL — ABNORMAL HIGH (ref 65–105)
POC Glucose: 307 mg/dL — ABNORMAL HIGH (ref 65–105)

## 2022-08-03 MED ORDER — ESCITALOPRAM OXALATE 10 MG PO TABS
10 MG | Freq: Every day | ORAL | Status: AC
Start: 2022-08-03 — End: ?
  Administered 2022-08-03 – 2022-08-04 (×2): 20 mg via ORAL

## 2022-08-03 MED ORDER — CITALOPRAM HYDROBROMIDE 20 MG PO TABS
20 MG | Freq: Every day | ORAL | Status: DC
Start: 2022-08-03 — End: 2022-08-03

## 2022-08-03 MED FILL — HYDROMORPHONE HCL 1 MG/ML IJ SOLN: 1 MG/ML | INTRAMUSCULAR | Qty: 1

## 2022-08-03 MED FILL — SENNOSIDES-DOCUSATE SODIUM 8.6-50 MG PO TABS: ORAL | Qty: 1

## 2022-08-03 MED FILL — OXYCODONE HCL 5 MG PO TABS: 5 MG | ORAL | Qty: 1

## 2022-08-03 MED FILL — ONDANSETRON HCL 4 MG/2ML IJ SOLN: 4 MG/2ML | INTRAMUSCULAR | Qty: 2

## 2022-08-03 MED FILL — HYDROCHLOROTHIAZIDE 25 MG PO TABS: 25 MG | ORAL | Qty: 1

## 2022-08-03 MED FILL — ESCITALOPRAM OXALATE 10 MG PO TABS: 10 MG | ORAL | Qty: 2

## 2022-08-03 MED FILL — CITALOPRAM HYDROBROMIDE 20 MG PO TABS: 20 MG | ORAL | Qty: 1

## 2022-08-03 MED FILL — POLYETHYLENE GLYCOL 3350 17 G PO PACK: 17 g | ORAL | Qty: 1

## 2022-08-03 MED FILL — ASPIRIN 325 MG PO TBEC: 325 MG | ORAL | Qty: 1

## 2022-08-03 MED FILL — OMEPRAZOLE 20 MG PO CPDR: 20 MG | ORAL | Qty: 1

## 2022-08-03 MED FILL — GERI-KOT 8.6 MG PO TABS: 8.6 MG | ORAL | Qty: 1

## 2022-08-03 MED FILL — HUMALOG 100 UNIT/ML IJ SOLN: 100 UNIT/ML | INTRAMUSCULAR | Qty: 1

## 2022-08-03 MED FILL — VALSARTAN 80 MG PO TABS: 80 MG | ORAL | Qty: 4

## 2022-08-03 MED FILL — ACETAMINOPHEN 10 MG/ML IV SOLN: 10 MG/ML | INTRAVENOUS | Qty: 100

## 2022-08-03 MED FILL — CEFAZOLIN SODIUM-DEXTROSE 2-4 GM/100ML-% IV SOLN: 2-4 GM/100ML-% | INTRAVENOUS | Qty: 100

## 2022-08-03 MED FILL — SODIUM CHLORIDE FLUSH 0.9 % IV SOLN: 0.9 % | INTRAVENOUS | Qty: 10

## 2022-08-03 NOTE — Plan of Care (Signed)
Problem: Discharge Planning  Goal: Discharge to home or other facility with appropriate resources  Outcome: Progressing  Flowsheets (Taken 08/03/2022 0725)  Discharge to home or other facility with appropriate resources:   Identify barriers to discharge with patient and caregiver   Identify discharge learning needs (meds, wound care, etc)     Problem: Pain  Goal: Verbalizes/displays adequate comfort level or baseline comfort level  Outcome: Progressing     Problem: Safety - Adult  Goal: Free from fall injury  Outcome: Progressing

## 2022-08-03 NOTE — Progress Notes (Signed)
OCCUPATIONAL THERAPY MISSED VISIT   OT Charge Capture  Rehab Caseload Tracker  -       Patient: Michelle Herring (56 y.o. female)  Room: 2118/2118    Primary Diagnosis: Tibial plateau fracture, right, closed, initial encounter [S82.141A]   Procedure(s) (LRB):  OPEN REDUCTION INTERNAL FIXATION TIBIAL PLATEAU (Right) 1 Day Post-Op  Date of Admission: 08/01/2022   Length of Stay:  2 day(s)  Insurance: Payor: GENERIC COMMERCIAL / Plan: GENERIC COMMERCIAL / Product Type: Indemnity /      Date: 08/03/2022  Time: 0845    OBJECTIVE:     Orders, labs, and chart reviewed on [place]. Discussed with Isatu, RN.    Patient was not seen for skilled occupational therapy evaluation, secondary to patient working with PT.    PLAN:     Our services will follow up as patient's condition and/or schedule permits.    Jeannette Corpus, OTD, OTR/L  August 03, 2022

## 2022-08-03 NOTE — Other (Addendum)
Request for diabetes education complete. Known diabetes patient admitted for c/o leg injury with initial BG of 448 and A1c of 10.1% (eAG 243). Patient reports using a Medtronic, MiniMed 630G insulin pump for BG management. Before admission patient reports she completed only one of two steps when replacing her insulin therefore her pump was not delivering any insulin.  Her pump is not broken and she will resume it after discharge.    Patient reports receiving a basal rate of 1.5 units/hour through her pump. This equates to a long acting insulin amount of 36 units if long acting insulin ever needed.  Patient believes she needs a higher basal rate and I agree.  Patient has a very confusing meal time bolus plan (set meal dose of 13 units plus using an insulin to carb ratio of 1) for meal bolus administration.      Her pump only allows for a max of 15 units insulin at each meal to be administered so no matter what she eats, after a 13 unit set meal dose she can only enter 2 carbs into her pump.  This is not how pumps are supposed to work. She believes she has several different dosing plans mixed together.    MOA of insulin pumps explained.  Discussed ways to get back on track while using the same amount of daily insulin currently allowed through her pump. Will stop by to see patient tomorrow with Glucommander settings that she can use as a guide once home.

## 2022-08-03 NOTE — Progress Notes (Signed)
PHYSICAL THERAPY EVALUATION       Acknowledge Orders  Time  PT Charge Capture  Rehab Caseload Tracker  [place] AM-PAC "6 Clicks" Basic Mobility Inpatient Short Form  -    Patient: Michelle Herring (56 y.o. female)  Room: 2118/2118    Primary Diagnosis: Tibial plateau fracture, right, closed, initial encounter [S82.141A]   Procedure(s) (LRB):  OPEN REDUCTION INTERNAL FIXATION TIBIAL PLATEAU (Right) 1 Day Post-Op  Date of Admission: 08/01/2022   Length of Stay:  2 day(s)  Insurance: Payor: GENERIC COMMERCIAL / Plan: GENERIC COMMERCIAL / Product Type: Indemnity /      Date: 08/03/2022  Time In: 0822       Time Out: 0853   Total Minutes: 31  Treatment Time: Patient received/participated in 20 minutes of treatment  functional mobility training, bed mobility training, transfer training, gait training, stair training, balance training, therapeutic exercises, therapeutic activities, AD training, patient education) during/immediately following PT evaluation.    Isolation:  No active isolations       MDRO: No active infections  Current diet order: ADULT DIET; Regular; 4 carb choices (60 gm/meal)    Precautions: falls   Ordered weight bearing status: non-weight bearing RLE    Per orders patient to have knee immobilizer but per pt and RN MD in this am and stated patient did not have to wear.  Pt prefers to wear during mobility for increased support at this time    ASSESSMENT:         Based on the objective data described below, the patient presents with  -decreased independence with functional mobility  -fair tolerance to amb due to stress on BUE during amb NWB w/ RW  -active participation in therex and activities  -active participation in gait training with RW NWB  -pt able to maintain NWB during mobility  - + motivated to participate in PT to improve functional activity tolerance and return to prior level of function    Patient is below baseline functional mobility and will benefit from skilled PT intervention  in the acute care setting to address the abovementioned impairments.    Patient's rehabilitation potential for below stated goals: good    Recommendations:  Recommend continued physical therapy during acute stay. Occupational Therapy. Recommend out of bed activity to counteract ill effects of bedrest, with assistance from staff as needed.  Discharge Recommendations: Home with family support.  Further Equipment Recommendations for Discharge: No DME needs, has DME available at home.     Functional Outcome Measure:    AM-PAC Inpatient Mobility Raw Score : 22  -  Home:   At this time and based on an AM-PAC score, no further PT is recommended upon discharge due to (i.e. patient at baseline functional status).  Recommend patient returns to prior setting with prior services.    This AMPAC score should be considered in conjunction with interdisciplinary team recommendations to determine the most appropriate discharge setting. Patient's social support, diagnosis, medical stability, and prior level of function should also be taken into consideration.  PRIOR LEVEL OF FUNCTION / HOME ENVIRONMENT:      Information was obtained by patient    Home environment:  Pt is staying w/ dghtr in 3 story home w/ 3 steps to enter w/out rails.  Will be able to stay on first floor and husband/SIL will assist into home w/ arm over assist  Prior level of function: independent  Home equipment: Husband has purchased a RW    PLAN:  Patient will benefit from skilled Physical Therapy intervention to address the above impairments to return to prior level of function.   PT Plan of Care: Daily    Patient will achieve PT goals in 1 week. Goals were created with input from the patient.    PHYSICAL THERAPY GOALS:     - Patient will be modified independent with bed mobility in preparation for EOB activities.   - Patient will be modified independent with transfers in preparation for OOB activities and ambulation.  - Patient will ambulate at the modified  independent level for 50 feet NWB RLE with the least restrictive device to promote functional independence at home.   - Patient will ascend/descend 3 stairs with handrail(s) with moderate assistance x2 to enter home   - Patient will demonstrate good balance to safely enable upright activities and reduce risk for falls.  - Patient will be modified independent with home exercise program to increase strength and endurance.  - Patient will state/observe Fall precautions.      PLANNED INTERVENTIONS:      Skilled Physical Therapy services will provide functional mobility training, therapeutic exercises, therapeutic activities, patient/caregiver education as indicated.  Skilled Physical Therapy services will modify and progress therapeutic interventions, address functional mobility deficits, address ROM and strength deficits, analyze and cue movement patterns and assess and modify postural abnormalities to reach the stated goals.    COMMUNICATION/EDUCATION:       Education: Ill effects of bedrest, benefits of activity, OOB activity as tolerated, with assist from staff as appropriate, OOB to chair for meals and mobilize as tolerated, activity as tolerated to counteract ill effects of bedrest, promote healing and return to prior level of function, call staff for assistance, safety, activity pacing, energy conservation, HEP, ankle pumps to promote improved circulation and prevent DVTs, weight bearing precautions, DME, allowing for time to adjust to changes in position, disposition, role of PT, PT plan of care   Education provided to:  patient and patient's spouse at bedside. Opportunity for questions and clarification was provided.   Readiness to learn indicated by: verbalized understanding, successful return demonstration, and appropriate questions asked   Barriers to learning/limitations: none   Comprehension: Patient communicated comprehension       SUBJECTIVE:      Patient reports that each time she has gotten up it has  gotten a little easier  Patient reports 4/10 pain   Pain Location: RLE    OBJECTIVE DATA SUMMARY:      Orders, labs, and chart reviewed on Michelle Herring. Communicated with patient's nurse (patient ok to be seen by PT), patient's spouse at bedside.     Present illness history:   Patient Active Problem List    Diagnosis Date Noted    Tibial plateau fracture, right, closed, initial encounter 08/01/2022    Dyslipidemia     Obesity     HTN (hypertension)     OSA on CPAP     Diabetes mellitus, type II, insulin dependent (HCC)       Previous medical history:   Past Medical History:   Diagnosis Date    Diabetes mellitus, type II, insulin dependent (HCC)     Dyslipidemia     HTN (hypertension)     Obesity     OSA on CPAP         COGNITIVE STATUS:     Mental Status:  Oriented x3 and alert   Communication:  speech and language intact, patient interacts appropriately  Follows commands:   intact   General cognition:  mood and affect normal, appropriate to situation.    Safety/Judgement:  appropriate awareness of environment and need for assistance as well as awareness of precautions       EXTREMITIES ASSESSMENT:      Range Of Motion:  BUE: WFL  BLE: WFL.  Right knee NT and slight decrease in R DF w/ inc discomfort in right calf w/ stretching    Strength:    BUE: WFL  BLE:  LLE WFL, RLE NT this date      THERAPEUTIC ACTIVITIES; FUNCTIONAL MOBILITY AND BALANCE STATUS:      Pt instructed in proper placement of knee immobilizer and able to donn modI    Transfers:  Supine to sit:  pt able to come to EOB w/ HOB elevated and moving RLE w/ BUE  Sit <> stand:  Inst for tech and CGA   required cont inst for pushing up from/reaching back to surfaces    Gait Training:  Pt instructed in and demonstrated proper use of RW   30 feet, w/ RW and Gt belt NWB RLE standing rest breaks x2 2/2 BUE fatigue    Pt able to maintain NWB    Stair Training:   Discussed over shoulder stair technique w/ patient and husband for entering dghtr home.  Pt will  have assist of two to enter home.  All questions answered and both parties stated they were comfortable w/ technique     Balance:   Static sitting balance:           good: maintains balance against moderate resistance       Static standing balance:      fair: maintains balance without assistance with cueing, needs assistive device      THERAPEUTIC EXERCISES:      Patient instructed in to complete 10 AP w/ each commercial break.  Other therex 3-4x daily for 10 reps.    Bed:  BLE AP w/ slight stretch into DF on right, gluteal/quad sets.  Per pt/RN ok to bend knee but seeking clarification from MD    Balance activities:   Worked to improve reactive postural responses: stable surface, standing.   Required SBA and  instructions for safety and technique.     ACTIVITY TOLERANCE:     - tolerance to functional activity with rest breaks  - motivated to increase activity  - pain is well managed    FINAL LOCATION:     Patient seated at edge of bed. Patient agrees to call for assistance. Patient set up with tray. (+) nurse present.    Thank you for this referral.  Iva Lento, PT, PT

## 2022-08-03 NOTE — Progress Notes (Signed)
OCCUPATIONAL THERAPY EVALUATION   Acknowledge Orders  Time  OT Charge Capture  Rehab Caseload Tracker    [place] AM-PAC "6 Clicks" Daily Activity Inpatient Short Form  -    Patient: Michelle Herring (56 y.o. female)  Room: 2118/2118    Primary Diagnosis: Tibial plateau fracture, right, closed, initial encounter [S82.141A]   Procedure(s) (LRB):  OPEN REDUCTION INTERNAL FIXATION TIBIAL PLATEAU (Right) 1 Day Post-Op  Date of Admission: 08/01/2022   Length of Stay:  2 day(s)  Insurance: Payor: GENERIC COMMERCIAL / Plan: GENERIC COMMERCIAL / Product Type: Indemnity /      Date: 08/03/2022  Time In: 1024        Time Out: 1048       Total Minutes: 24   Treatment time: 15 minutes    Isolation:  No active isolations       MDRO: No active infections    Precautions: falls   Ordered weight bearing status: non weight bearing RIGHT lower extremity    Current diet order: ADULT DIET; Regular; 4 carb choices (60 gm/meal)    ASSESSMENT     Based on the objective data described below, the patient presents with     -  decreased functional standing balance    -  decreased tolerance to sustained activity  -  right lower extremity post op pain  -  decreased flexibility  -  decreased ability to reach RLE for dressing  -  edu on AE for LB dressing, patient has all equipment at home   -  able to maintain WB status    Patient will benefit from skilled occupational therapy intervention to address the above impairments.    Patient's rehabilitation potential is considered to be Good for below stated goals.     Recommendations:  Recommend continued occupational therapy during acute stay.   Recommend out of bed activity to counteract ill effects of bedrest, with assistance from staff as needed.    Discharge Recommendations:   -  Patient plans on returning home with family    FUNCTIONAL ASSESSMENT  AM-PAC Inpatient Daily Activity Raw Score: 22    -  Home:   At this time and based on an AM-PAC score, no further OT is recommended upon  discharge due to (i.e. patient at baseline functional status).  Recommend patient returns to prior setting with prior services.    This AMPAC score should be considered in conjunction with interdisciplinary team recommendations to determine the most appropriate discharge setting. Patient's social support, diagnosis, medical stability, and prior level of function should also be taken into consideration.    Equipment Recommendations for Discharge:   -  no needs anticipated; has DME available at home; provided patient with leg lifter      PRIOR LEVEL OF FUNCTION     Information was obtained by: patient  Home environment: Patient lives with spouse in a 1 story home. Ramp available  Prior level of function: Patient is independent with no limitations.  Prior level of Instrumental Activities of Daily Living: Patient is independent with all IADLs. Works full time as a Engineer, maintenance, reacher, sock aid, rolling walker    PLAN      Patient will be followed by occupational therapy to address goals while hospitalized as patient's status and schedule permit.   Patient to be seen 2-3 days x 4 [place] for Adaptive equipment, ADI training, activity tolerance, functional balance training, functional mobility training, therapeutic exercise, therapeutic activity, and  energy conservation     Patient and/or family have participated as able in goal setting and plan of care.    GOALS     OT goals initiated 08/03/2022 and will be met by patient within 10 sessions     -  Patient will perform upper body ADLs with independence AE/DME PRN.  -  Patient will be modified independent with lower body ADLs, AE/DME PRN.  -  Patient will perform toileting tasks with modified independence,  AE/DME PRN.  -  Patient will be modified independent with functional transfers using LRAD.  -  Patient will complete private room navigation with modified independence to complete toileting tasks in bathroom.  -  Patient will tolerate standing for 15  minutes with good balance to promote maximal independence in standing level ADLs.    EDUCATION/COMMUNICATION     Barriers to learning/limitations: None    Education provided YP:[id number] on (+) role of OT, (+) OT plan of care, (+) Instructed patient in the benefits of maintaining activity tolerance, functional mobility, and independence with self care tasks during acute stay  to ensure safe return home and to baseline. Encouraged patient to increase frequency and duration OOB, be out of bed for all meals, perform daily ADLs (as approved by RN/MD regarding bathing etc), and performing functional mobility to/from bathroom with staff assistance as needed., (+) instructed patient on the importance of activity while hospitalized to prevent a decline in function, (+) encouraged patient to sit up in chair for 45 (+) minutes or as tolerated 2-3 times a day, with staff assistance as needed, (+) the importance of maintaining UE muscle strength and activity tolerance while hospitalized to prevent a decline in function, (+) staff assistance with mobility, (+) change positions frequently, (+) functional mobility, (+) discharge disposition/recommendations, (+) ADL training, (+) adaptive equipment use, (+) safety    Educational handouts issued: none this session    Patient / family response to education: verbalized understanding, demonstrated understanding    SUBJECTIVE     Patient "I think that would be helpful, especially since I have a tall bed at home" [referring to leg lifter]    Pain Assessment: 7 / 10, location: RLE    OBJECTIVE DATA SUMMARY     Orders, labs, and chart reviewed on [place]. Communicated with nursing staff. Patient cleared to participate in Occupational Therapy evaluation.    Patient was admitted to the hospital on 08/01/2022 with   Chief Complaint   Patient presents with    Leg Injury     Present illness history:   Patient Active Problem List    Diagnosis Date Noted    Tibial plateau fracture, right,  closed, initial encounter 08/01/2022    Dyslipidemia     Obesity     HTN (hypertension)     OSA on CPAP     Diabetes mellitus, type II, insulin dependent (HCC)       Previous medical history:   Past Medical History:   Diagnosis Date    Diabetes mellitus, type II, insulin dependent (HCC)     Dyslipidemia     HTN (hypertension)     Obesity     OSA on CPAP        PATIENT FOUND     Bed, (-) bed/chair exit alarm, (+) visitor: spouse    Treatment included: therapeutic activity, functional mobility retraining, functional balance retraining, ADI retraining, activity tolerance and/or educational instruction during/immediately following OT evaluation    COGNITIVE STATUS  Mental status:  Orientation: Patient is oriented to person, place, month and year   Neuro state: awake, alert, and pleasant  Communication: normal, speech and language intact  Attention Span:  normal  General Cognition:  intact   Follows commands: intact  Safety/Judgement: good safety awareness  Hearing:   grossly intact  Vision:   grossly intact, glasses    UPPER EXTREMITY ASSESSMENT     Dominance:right  RIGHT:  ACTIVE range of motion is WNL Strength is grossly graded as  5/5   LEFT:   ACTIVE range of motion is WNL  Strength is grossly graded as 5/5       Comment(s):   none    ACTIVITIES OF DAILY LIVING     Based on direct observation, simulation and clinical assessment.  (clincial judgement based on: activity tolerance, balance, safety awareness, cognition, functional strength/ROM/coordination of all extremities)    Eating:           - independent, set-up in hospital setting  Grooming:     - stand-by assistance  UB bathing:   - contact guard assistance  LB bathing:   -  minimum assistance  UB dressing: - contact guard assistance  LB dressing: - minimum assistance  Toileting:       - stand-by assistance    Comment(s):   Addressed functional transfer, bed mobility, functional ambulation, standing balance, sitting balance in prep for participation in  ADLs  -  Patient tolerated standing 5 minutes at sink side with stand-by assistance while performing washing face/hand.  Patient demonstrating fair (+).  -   Patient ambulated 15 feet to and from bathroom with contact guard assistance, rolling walker.  Patient completed toilet transfer with contact guard assistance, rolling walker.  Patient performed toileting with stand-by assistance.     FUNCTIONAL MOBILITY STATUS     Mobility:  -  Supine to sit -  standby assistance  -  Sit to supine -  contact guard assistance  -  Sit to stand -  contact guard assistance, rolling walker  -  Stand to sit -  contact guard assistance, rolling walker  -  Functional ambulation in room: contact guard assistance, rolling walker     Transfers:  -  Toilet transfers: contact guard assistance, rolling walker    BALANCE AND ACTIVITY TOLERANCE     Static Sitting Balance -           good (-):  maintains balance against minimum resistance  Dynamic Sitting Balance -      fair (+):    able to perform full UE ROM ranges with SBA/supervision   Static Standing Balance -       fair (+):    maintains balance with independence without cueing  Dynamic Standing Balance -  fair (-):     able to perform partial UE ROM ranges with CGA    Activity Tolerance:   - good tolerance to activity during OT session  - motivated to increase activity  - activity is limited by pain    FINAL LOCATION     Patient positioned in bed, all needs within reach, agrees to call for assistance    Thank you for this referral.    Jeannette Corpus, OTD, OTR/L  August 03, 2022

## 2022-08-03 NOTE — Progress Notes (Signed)
INTERNAL MEDICINE PROGRESS NOTE    Date of note:      August 03, 2022    Patient:               Michelle Herring, 56 y.o., female  Admit Date:        08/01/2022  Length of Stay:  2 day(s)    Problem List:   Patient Active Problem List   Diagnosis    Dyslipidemia    Obesity    HTN (hypertension)    OSA on CPAP    Diabetes mellitus, type II, insulin dependent (HCC)    Tibial plateau fracture, right, closed, initial encounter       Subjective + interval history     Doing well. Pain fairly controlled.      Assessment :      Comminuted fracture medial/lateral right tibial plateau, s/p mechanical fall -> s/p open reduction internal fixation tibial plateau, bicondylar fracture with comminution on 08/02/2022  IDDM 1 uncontrolled with hyperglycemia  Hypertension  OSA - usesCPAP at home  Hx melanoma s/p amputation left great toe  Obesity BMI 35  History of gastric sleeve surgery     Plan :      A1c 10.1.  Blood sugars about 250.  Discussed with diabetes educator.  We will continue insulin per Glucomander with further adjustment to keep blood sugars less than 200.  Hold off insulin pump.    Postop hemoglobin stable 9.2.  Monitor closely.    Renal function stable.  Creatinine 0.9.  Blood pressure elevated in the 160s.  Resume valsartan plus HCTZ.  Stop IV fluids.  Encourage oral intake.    Continue judicious pain management  PT OT as tolerated    Continue CPAP nightly    DVT prophylaxis-aspirin 325 mg daily per orthopedics  GI prophylaxis-omeprazole  Bowel regimen    Anticipate discharge tomorrow  Discussed with husband at bedside      Objective:     Visit Vitals  BP (!) 169/70   Pulse 88   Temp 98.2 F (36.8 C) (Oral)   Resp 16   Ht 5\' 3"  (1.6 m)   Wt 200 lb (90.7 kg)   SpO2 93%   BMI 35.43 kg/m                Intake/Output Summary (Last 24 hours) at 08/03/2022 0825  Last data filed at 08/02/2022 1657  Gross per 24 hour   Intake 1000 ml   Output --   Net 1000 ml       Physical Exam:     GEN - AAOx3, NAD  HEENT mucous  membranes moist  Neck - supple, no JVD  Cardiac - RRR, S1, S2, no murmurs  Chest/Lungs - clear to auscultation without wheezes or rhonchi  Abdomen - soft, nontender, nondistended, normal bowel sounds  Extremities - no clubbing/ cyanosis/ edema/surgical dressing on right leg  Neuro -follows commands    Current medications:       Current Facility-Administered Medications:     oxyCODONE (ROXICODONE) immediate release tablet 5 mg, 5 mg, Oral, Q4H PRN, 08/04/2022, MD, 5 mg at 08/03/22 0513    sodium chloride flush 0.9 % injection 5-40 mL, 5-40 mL, IntraVENous, 2 times per day, 04-24-1984, MD, 10 mL at 08/02/22 2243    sodium chloride flush 0.9 % injection 5-40 mL, 5-40 mL, IntraVENous, PRN, 04-24-1984, MD    0.9 % sodium chloride infusion, , IntraVENous, PRN, Arita Miss  Lenore Manner, MD    acetaminophen (TYLENOL) tablet 650 mg, 650 mg, Oral, Q4H PRN, Arita Miss, MD    HYDROmorphone (DILAUDID) injection 0.5 mg, 0.5 mg, IntraVENous, Q3H PRN, Arita Miss, MD, 0.5 mg at 08/03/22 0159    ondansetron (ZOFRAN-ODT) disintegrating tablet 4 mg, 4 mg, Oral, Q8H PRN **OR** ondansetron (ZOFRAN) injection 4 mg, 4 mg, IntraVENous, Q6H PRN, Arita Miss, MD    sennosides-docusate sodium (SENOKOT-S) 8.6-50 MG tablet 1 tablet, 1 tablet, Oral, BID, Arita Miss, MD, 1 tablet at 08/02/22 2243    bisacodyl (DULCOLAX) EC tablet 5 mg, 5 mg, Oral, Daily PRN, Arita Miss, MD    polyethylene glycol [facility]) packet 17 g, 17 g, Oral, Daily PRN, Arita Miss, MD    aspirin EC tablet 325 mg, 325 mg, Oral, BID, Arita Miss, MD, 325 mg at 08/02/22 2243    dextrose 50 % IV solution, 10-50 mL, IntraVENous, PRN, Arita Miss, MD    glucagon (rDNA) injection 1 mg, 1 mg, IntraMUSCular, PRN, Arita Miss, MD    insulin glargine (LANTUS) injection vial 1-100 Units, 1-100 Units, SubCUTAneous, QHS, Arita Miss, MD, 30 Units at 08/02/22 2243    insulin lispro (HUMALOG) injection vial 1-100 Units, 1-100 Units, SubCUTAneous, 4x Daily AC & HS, Arita Miss, MD,  6 Units at 08/02/22 2242    insulin lispro (HUMALOG) injection vial 1-100 Units, 1-100 Units, SubCUTAneous, PRN, Arita Miss, MD    valsartan (DIOVAN) tablet 320 mg, 320 mg, Oral, Daily, 320 mg at 08/02/22 0847 **AND** hydroCHLOROthiazide (HYDRODIURIL) tablet 12.5 mg, 12.5 mg, Oral, Daily, Arita Miss, MD, 12.5 mg at 08/01/22 1843    senna (SENOKOT) tablet 8.6 mg, 1 tablet, Oral, Nightly, Arita Miss, MD, 8.6 mg at 08/02/22 2243    polyethylene glycol (GLYCOLAX) packet 17 g, 17 g, Oral, Daily, Arita Miss, MD    omeprazole [facility]) delayed release capsule 20 mg, 20 mg, Oral, Daily, Arita Miss, MD, 20 mg at 08/03/22 1275    Labs:     Recent Results (from the past 24 hour(s))   Hemoglobin A1C    Collection Time: 08/02/22  9:06 AM   Result Value Ref Range    Hemoglobin A1C 10.1 (H) 3.8 - 5.6 %   POCT Glucose    Collection Time: 08/02/22 11:09 AM   Result Value Ref Range    POC Glucose 244 (H) 65 - 105 mg/dL   EKG 12 Lead    Collection Time: 08/02/22 11:29 AM   Result Value Ref Range    Ventricular Rate 84 BPM    Atrial Rate 84 BPM    P-R Interval 140 ms    QRS Duration 80 ms    Q-T Interval 390 ms    QTC Calculation (Bezet) 460 ms    Calculated P Axis 44 degrees    Calculated R Axis 55 degrees    Calculated T Axis 18 degrees    DIAGNOSIS, 93000       Normal sinus rhythm  Normal ECG  No previous ECGs available  Confirmed by Avon Gully, M.D., Jomarie Longs [phone]) on 08/02/2022 3:08:00 PM     POCT Glucose    Collection Time: 08/02/22 12:11 PM   Result Value Ref Range    POC Glucose 245 (H) 65 - 105 mg/dL   POCT Glucose    Collection Time: 08/02/22  1:42 PM   Result Value Ref Range    POC Glucose 242 (H)  65 - 105 mg/dL   POCT Glucose    Collection Time: 08/02/22  6:47 PM   Result Value Ref Range    POC Glucose 308 (H) 65 - 105 mg/dL   POCT Glucose    Collection Time: 08/02/22  9:12 PM   Result Value Ref Range    POC Glucose 331 (H) 65 - 105 mg/dL   CBC with Auto Differential    Collection Time: 08/03/22  3:06 AM   Result Value  Ref Range    WBC 10.0 4.0 - 11.0 1000/mm3    RBC 3.37 (L) 3.60 - 5.20 M/uL    Hemoglobin 9.2 (L) 11.0 - 16.0 gm/dl    Hematocrit 28.9 (L) 35.0 - 47.0 %    MCV 85.8 80.0 - 98.0 fL    MCH 27.3 25.4 - 34.6 pg    MCHC 31.8 30.0 - 36.0 gm/dl    Platelets 233 140 - 450 1000/mm3    MPV 10.3 (H) 6.0 - 10.0 fL    RDW 41.2 36.4 - 46.3      Nucleated RBCs 0 0 - 0      Immature Granulocytes 0.4 0.0 - 3.0 %    Neutrophils Segmented 76.0 (H) 34 - 64 %    Lymphocytes 14.9 (L) 28 - 48 %    Monocytes 8.1 1 - 13 %    Eosinophils 0.3 0 - 5 %    Basophils 0.3 0 - 3 %   Basic Metabolic Panel    Collection Time: 08/03/22  3:06 AM   Result Value Ref Range    Potassium 4.2 3.5 - 5.1 mEq/L    Chloride 105 98 - 107 mEq/L    Sodium 137 136 - 145 mEq/L    CO2 25 20 - 31 mEq/L    Glucose 320 (H) 74 - 106 mg/dl    BUN 11 9 - 23 mg/dl    Creatinine 0.93 0.55 - 1.02 mg/dl    GFR African American >60.0      GFR Non-African American >60      Calcium 9.2 8.7 - 10.4 mg/dl    Anion Gap 7 5 - 15 mmol/L   POCT Glucose    Collection Time: 08/03/22  6:47 AM   Result Value Ref Range    POC Glucose 276 (H) 65 - 105 mg/dL       RAD Results:  FL LESS THAN 1 HOUR    Result Date: 08/02/2022  Indication: ORIF right tibia and fibula     IMPRESSION: 3 fluoroscopic images obtained. 2 minutes 21 seconds fluoroscopy time used showing extensive hardware at the proximal tibia. Electronically signed by: Debby Bud, MD 08/02/2022 4:49 PM EDT          Workstation ID: [id number]     XR CHEST PORTABLE    Result Date: 08/01/2022  Clinical history: Tibial fracture, preop EXAMINATION: AP view of the chest 08/01/2022 Correlation: None FINDINGS: Trachea and heart size are within normal limits. Lungs are clear.     IMPRESSION: No acute pulmonary process. Electronically signed by: Kathlene Cote, MD 08/01/2022 6:05 PM EDT          Workstation ID: [id number]     XR TIBIA FIBULA RIGHT (2 VIEWS)    Result Date: 08/01/2022  Clinical history: Fall EXAMINATION: 2 views right tibia and fibula  FINDINGS: There is a comminuted fracture of the medial and lateral tibial plateau. No fracture of the fibula. Lipohemarthrosis.     IMPRESSION: Comminuted medial and lateral  tibial plateau fracture. Electronically signed by: Jolayne PantherSanjay Patel, MD 08/01/2022 4:09 PM EDT          Workstation ID: [id number]     XR KNEE RIGHT (MIN 4 VIEWS)    Result Date: 08/01/2022  Clinical history: Fall, pain and swelling EXAMINATION: 4 views right knee FINDINGS: There is small joint effusion. No fracture of the distal femur or patella. There is a comminuted fracture of the medial and lateral tibial plateau with angulation. No definitive fracture of the proximal fibula.     IMPRESSION: Comminuted fracture medial and lateral tibial plateau. Electronically signed by: Jolayne PantherSanjay Patel, MD 08/01/2022 4:07 PM EDT          Workstation ID: [id number]         Total clinical care time was 38  minutes of which more than 50% was spent in coordination of care and counseling (time spent with patient/family face to face, physical exam, reviewing laboratory and imaging investigations, speaking with physicians and nursing staff involved in this patient's care).     Dragon medical dictation system was used for part of this note. Unintentional voice recognition errors may occur    Tia MaskerUCHITA Nasteho Glantz,  MD  [facility]  [facility]  August 03, 2022  Time: 8:25 AM

## 2022-08-03 NOTE — Anesthesia Post-Procedure Evaluation (Signed)
Department of Anesthesiology  Postprocedure Note    Patient: Michelle Herring  MRN: [id number]  Birthdate: March 04, 1966  Date of evaluation: 08/03/2022      Procedure Summary     Date: 08/02/22 Room / Location: [place] MAIN 10 / [place] MAIN OR    Anesthesia Start: 1401 Anesthesia Stop: 4008    Procedure: OPEN REDUCTION INTERNAL FIXATION TIBIAL PLATEAU (Right: Leg Lower) Diagnosis:       History of open reduction and internal fixation (ORIF) procedure      (History of open reduction and internal fixation (ORIF) procedure [Q76.195])    Surgeons: Myra Rude, MD Responsible Provider: Coy Saunas, MD    Anesthesia Type: General ASA Status: 2          Anesthesia Type: General    Aldrete Phase I: Aldrete Score: 9    Aldrete Phase II:        Anesthesia Post Evaluation    Patient location during evaluation: bedside  Patient participation: complete - patient participated  Level of consciousness: awake and alert  Pain score: 0  Airway patency: patent  Nausea & Vomiting: no nausea and no vomiting  Complications: no  Cardiovascular status: hemodynamically stable  Respiratory status: acceptable  Hydration status: euvolemic  Multimodal analgesia pain management approach

## 2022-08-03 NOTE — Progress Notes (Signed)
Postoperative day #1.  Stable overnight.  Vitals stable.  Neuro intact.  Plan-  Mobilize with physical therapy.  Essentially NWB, right LE.  OK to touch toes to ground, for balance.  Home tomorrow.

## 2022-08-04 LAB — BASIC METABOLIC PANEL
Anion Gap: 7 mmol/L (ref 5–15)
BUN: 9 mg/dl (ref 9–23)
CO2: 28 mEq/L (ref 20–31)
Calcium: 9.6 mg/dl (ref 8.7–10.4)
Chloride: 103 mEq/L (ref 98–107)
Creatinine: 0.9 mg/dl (ref 0.55–1.02)
GFR African American: >60
GFR Non-African American: >60
Glucose: 300 mg/dl — ABNORMAL HIGH (ref 74–106)
Potassium: 3.8 mEq/L (ref 3.5–5.1)
Sodium: 138 mEq/L (ref 136–145)

## 2022-08-04 LAB — POCT GLUCOSE
POC Glucose: 317 mg/dL — ABNORMAL HIGH (ref 65–105)
POC Glucose: 354 mg/dL — ABNORMAL HIGH (ref 65–105)
POC Glucose: 299 mg/dL — ABNORMAL HIGH (ref 65–105)

## 2022-08-04 LAB — CBC WITH AUTO DIFFERENTIAL
Basophils: 0.6 % (ref 0–3)
Eosinophils: 1.3 % (ref 0–5)
Hematocrit: 28.1 % — ABNORMAL LOW (ref 35.0–47.0)
Hemoglobin: 9.1 gm/dl — ABNORMAL LOW (ref 11.0–16.0)
Immature Granulocytes: 0.3 % (ref 0.0–3.0)
Lymphocytes: 23.4 % — ABNORMAL LOW (ref 28–48)
MCH: 27.1 pg (ref 25.4–34.6)
MCHC: 32.4 gm/dl (ref 30.0–36.0)
MCV: 83.6 fL (ref 80.0–98.0)
MPV: 10.2 fL — ABNORMAL HIGH (ref 6.0–10.0)
Monocytes: 9.6 % (ref 1–13)
Neutrophils Segmented: 64.8 % — ABNORMAL HIGH (ref 34–64)
Nucleated RBCs: 0 (ref 0–0)
Platelets: 236 10*3/uL (ref 140–450)
RBC: 3.36 M/uL — ABNORMAL LOW (ref 3.60–5.20)
RDW: 40 (ref 36.4–46.3)
WBC: 9.4 10*3/uL (ref 4.0–11.0)

## 2022-08-04 MED ORDER — ASPIRIN 325 MG PO TBEC
325 MG | ORAL_TABLET | Freq: Two times a day (BID) | ORAL | 0 refills | Status: AC
Start: 2022-08-04 — End: ?

## 2022-08-04 MED ORDER — OXYCODONE HCL 5 MG PO TABS
5 MG | ORAL_TABLET | Freq: Four times a day (QID) | ORAL | 0 refills | Status: AC | PRN
Start: 2022-08-04 — End: 2022-08-11

## 2022-08-04 MED ORDER — POLYETHYLENE GLYCOL 3350 17 G PO PACK
17 g | PACK | Freq: Every day | ORAL | 0 refills | Status: AC
Start: 2022-08-04 — End: 2022-09-03

## 2022-08-04 MED ORDER — OMEPRAZOLE 20 MG PO CPDR
20 MG | ORAL_CAPSULE | Freq: Every day | ORAL | 0 refills | Status: AC
Start: 2022-08-04 — End: ?

## 2022-08-04 MED FILL — ASPIRIN 325 MG PO TBEC: 325 MG | ORAL | Qty: 1

## 2022-08-04 MED FILL — OXYCODONE HCL 5 MG PO TABS: 5 MG | ORAL | Qty: 1

## 2022-08-04 MED FILL — HUMALOG 100 UNIT/ML IJ SOLN: 100 UNIT/ML | INTRAMUSCULAR | Qty: 7

## 2022-08-04 MED FILL — GERI-KOT 8.6 MG PO TABS: 8.6 MG | ORAL | Qty: 1

## 2022-08-04 MED FILL — SENNOSIDES-DOCUSATE SODIUM 8.6-50 MG PO TABS: ORAL | Qty: 1

## 2022-08-04 MED FILL — LANTUS 100 UNIT/ML SC SOLN: 100 UNIT/ML | SUBCUTANEOUS | Qty: 39

## 2022-08-04 NOTE — Discharge Instructions (Signed)
Your information:  Name: Michelle Herring  DOB: April 19, 1966    What to do after you leave the hospital:    Recommended diet: regular diet    Recommended activity: activity as tolerated with knee immobilizer and walker        The following personal items were collected during your admission and were returned to you:    Belongings  Dental Appliances: None  Vision - Corrective Lenses: Eyeglasses  Hearing Aid: None  Clothing: Shirt, Pants  Jewelry: Ring (2 white rings)  Body Piercings Removed: N/A  Electronic Devices: Buyer, retail (Notify Protective Services/Security): None  Other Valuables: Purse, Wallet  Home Medications: Other (Comment) (insulin pump(off))  Valuables Given To: Patient  Provide Name(s) of Who Valuable(s) Were Given To: Sanaai    Information obtained by:  By signing below, I understand that if any problems occur once I leave the hospital I am to contact Primary Care Provider.  I understand and acknowledge receipt of the instructions indicated above.      Tibial Plateau Fracture: Care Instructions  Overview     A tibial plateau fracture is a break of the shinbone (tibia) at the knee. The tibial plateau is the flat area at the top of the shinbone that the thighbone (femur) rests on.  The break can range from a crack to a shattered bone. It depends on how much force caused it and how strong your bones are.  Treatment depends on how bad the break is and whether your knee was healthy to start with. Most people need surgery to join the pieces of bone together with plates and screws. Some people will need a joint replacement. If the break is minor, you may wear a hinged knee brace and use a walker or crutches for 8 to 12 Lennartz. It's important to start moving your knee as soon as you can after your injury or surgery. You may also have physical therapy.  Follow-up care is a key part of your treatment and safety. Be sure to make and go to all appointments, and call your doctor if you are having  problems. It's also a good idea to know your test results and keep a list of the medicines you take.  How can you care for yourself at home?  Be safe with medicines. Read and follow all instructions on the label.  If you are not taking a prescription pain medicine, ask your doctor if you can take an over-the-counter medicine.  If the doctor gave you a prescription medicine for pain, take it as prescribed.  Store your prescription pain medicines where no one else can get to them. When you are done using them, dispose of them quickly and safely. Your local pharmacy or hospital may have a drop-off site.  Do not put weight on your leg until your doctor tells you to. Use a walker or crutches to walk.  Put ice or a cold pack on your knee for 10 to 20 minutes at a time. Try to do this every 1 to 2 hours for the next 3 days (when you are awake). Put a thin cloth between the ice and your skin.  Prop up your leg on pillows when you ice it or anytime you sit or lie down. Try to keep it above the level of your heart. This will help reduce swelling.  Start moving your knee. As soon as your doctor says it's okay, straighten and bend your knee. Do it as often as  you can. This can help you recover.  Do ankle pumps to help reduce swelling and stiffness. To do this exercise:  Lie or sit on your bed.  Point your toes and feet up toward your knees as far as you can. Then point them away from you as far as you can.  Switch between pointing your feet up and pointing them down.  Do this for 2 to 3 minutes, 2 to 3 times an hour.  Do exercises to help strengthen your quadriceps (thigh muscles).  While sitting in a chair, straighten your leg and hold for 6 seconds. Then lower your leg and rest for up to 10 seconds.  Repeat 8 to 12 times with each leg. Do this every day, up to 3 times a day.  When this thigh-strengthening exercise becomes easy, you can add a light weight to your ankle.  Do bed knee bends.  Lie or sit on your bed.  Bend your  affected knee by sliding your foot toward you. Stop when your knee no longer bends.  Hold this position for 15 to 30 seconds, and then slide your leg back down the bed.  Do this several times.  Take care of your brace or splint.  Follow the instructions your doctor gives you. If the brace or splint is removable, don't take it off unless your doctor tells you to.  Keep it dry.  Tape a sheet of plastic to cover it when you shower. Water under the device can make your skin itch and hurt.  Never cut your brace or stick anything down inside it to scratch an itch on your leg.  When should you call for help?   Call 911 anytime you think you may need emergency care. For example, call if:    You passed out (lost consciousness).     You have severe trouble breathing.     You have sudden chest pain and shortness of breath, or you cough up blood.   Call your doctor now or seek immediate medical care if:    You have new or worse pain.     Your foot is cool or pale or changes color.     You have tingling, weakness, or numbness in your toes.     Your brace or splint feels too tight.     You have signs of a blood clot in your leg (called a deep vein thrombosis), such as:  Pain in your calf, back of the knee, thigh, or groin.  Swelling in the leg or groin.  A color change on the leg or groin. The skin may be reddish or purplish, depending on your usual skin color.   Watch closely for changes in your health, and be sure to contact your doctor if:    You have a problem with your splint or brace.     You do not get better as expected.     You have problems with medicine.   Where can you learn more?  Go to [employer] and enter A252 to learn more about "Tibial Plateau Fracture: Care Instructions."  Current as of: May 31, 2022               Content Version: 13.8   2006-2023 [employer].   Care instructions adapted under license by [facility]. If you have questions about a medical condition or  this instruction, always ask your healthcare professional. Healthwise, Incorporated disclaims any warranty or liability for your use of this information.  General Discharge: Care Instructions  Your Care Instructions     One or more doctors have given you a physical exam, reviewed your symptoms, and asked about your past medical problems. You have had tests as needed.  At this time, the doctors feel it is safe for you to go home.  It is very important that you follow up with your doctor as directed. If you continue to have symptoms, you may need more tests or treatment.  The doctor has checked you carefully, but problems can develop later. If you notice any problems or new symptoms,  get medical treatment right away.  Follow-up care is a key part of your treatment and safety. Be sure to make and go to all appointments, and call your doctor if you are having problems. It's also a good idea to know your test results and keep a list of the medicines you take.  How can you care for yourself at home?  Keep track of any new symptoms or changes in your symptoms.  Rest until you feel better.  Be safe with medicines. Take your medicines exactly as prescribed. Call your doctor if you think you are having a problem with your medicine.  Do not drive after taking a prescription pain medicine.  When should you call for help?   Call 911 anytime you think you may need emergency care. For example, call if:    You passed out (lost consciousness).   Call your doctor now or seek immediate medical care if:    You have new symptoms like fever, difficulty breathing, vomiting, or rash.     You have new or different pain.     You are confused and are having trouble thinking clearly.     Your symptoms are getting worse.   Watch closely for changes in your health, and be sure to contact your doctor if:    You do not get better as expected.   Where can you learn more?  Go to https://www.bennett.info/ and enter G150 to learn more  about "General Discharge: Care Instructions."  Current as of: May 24, 2022               Content Version: 13.8   2006-2023 [employer], [employer].   Care instructions adapted under license by [facility]. If you have questions about a medical condition or this instruction, always ask your healthcare professional. [employer] any warranty or liability for your use of this information.

## 2022-08-04 NOTE — Care Coordination-Inpatient (Signed)
Patient to DC home with family assistance, no HHA or DME needs.     Family to transport home.

## 2022-08-04 NOTE — Other (Addendum)
Follow up with patient regarding home management of insulin pump. A1c 10.1% during this visit.  Patient in need of insulin pump training and verification of dosing recommendations.    Gave patient [employer] manufacturer instructions for pump setting management.  Recommended patient use the Bolus Wizard feature as it was meant to be used so that when the pump is downloaded the information will be correct. Patient will need to see her Endocrinologist after traveling back home.     Patient reports having a TDD max amount of 80 units per day.  Infusion sets are to be replaced every 3rd day or sooner. Patient states her infusion site on her abdomen becomes red and itchy on day 2 so she changes her site more often than many patients.  Patient to discuss this with her Endocrinologist after discharge.    Patient reports having a snack last night for which no insulin was administered. Patient aware of the 39 units of Lantus that is circulating until ~21:00 tonight.  She has eaten bkfst and received a meal bolus.  Patient has a 3.5 hour ride home. States she will replace her pump and sensor device once home.    Patient very pleasant and receptive to information provided during her stay. She has no more questions for me at this time.

## 2022-08-04 NOTE — Progress Notes (Signed)
Patient was discharged from the unit at 1243 with the following belongings Dental-None; Vision-Eyeglasses; Hearing Aid-None; Clothing-shirt, pants ; Jewelry-ring ; Electronic-cell phone ; Weapons- None; Home Medications-None; Other Valuables-purse, wallet

## 2022-08-04 NOTE — Progress Notes (Signed)
PHYSICAL THERAPY      Acknowledge Orders  Time  PT Charge Capture  Rehab Caseload Tracker  [place]  -       Patient: Michelle Herring (56 y.o. female)  Room: 2118/2118    Primary Diagnosis: Tibial plateau fracture, right, closed, initial encounter [S82.141A]   Procedure(s) (LRB):  OPEN REDUCTION INTERNAL FIXATION TIBIAL PLATEAU (Right) 2 Days Post-Op  Date of Admission: 08/01/2022   Insurance: Payor: GENERIC COMMERCIAL / Plan: GENERIC COMMERCIAL / Product Type: Indemnity /      Date: 08/04/2022      OBJECTIVE:     Orders, labs, and chart reviewed on [place]. Discussed with patient's nurse (patient ok to be seen by PT).    Patient just finishing up with OT session getting ready to DC.  In room w/ OT reviewed safety w/ mobility (not using leg lifter during amb), taking her time and allowing husband to assist.  Importance of NWB and safety to prevent further damage to knee or additional damage.  Stop on ride home at midway point and get out of car for a bit also AP during ride.  All questions answered.    PLAN:     Our services will follow up if patient not discharged.    Iva Lento, PT  August 04, 2022

## 2022-08-04 NOTE — Progress Notes (Signed)
Progress Note    Patient name: Michelle Herring is a/an 56 y.o. female     S: Better     O: VSS, Hgb   Lab Results   Component Value Date/Time    HGB 9.1 08/04/2022 01:37 AM   , Neuro intact, +OOB, +Swallow,     A: Stable POD 2 Days Post-Op    P: OOB with PT, DC home today, Non weight bearing, Fu [place]  08/04/2022 9:31 AM

## 2022-08-04 NOTE — Progress Notes (Signed)
INTERNAL MEDICINE PROGRESS NOTE    Date of note:      August 04, 2022    Patient:               Michelle Herring, 56 y.o., female  Admit Date:        08/01/2022  Length of Stay:  3 day(s)    Problem List:   Patient Active Problem List   Diagnosis    Dyslipidemia    Obesity    HTN (hypertension)    OSA on CPAP    Diabetes mellitus, type II, insulin dependent ([place])    Tibial plateau fracture, right, closed, initial encounter       Subjective + interval history     Doing well. Eager to go home   Assessment :     Comminuted fracture medial/lateral right tibial plateau, s/p mechanical fall -> s/p open reduction internal fixation tibial plateau, bicondylar fracture with comminution on 08/02/2022  IDDM 1 uncontrolled with hyperglycemia  Hypertension  OSA - usesCPAP at home  Hx melanoma s/p amputation left great toe  Obesity BMI 35  History of gastric sleeve surgery       Plan :     Doing well post op    Hemoglobin stable. Renal function stable. Continue valsartan HCTZ    A1c 10.1- patient was seen by diabetes educator, patient was provided education about adjustment of insulin pump. She will follow up with her endocrinologist upon discharge.    DVT ppx : per Dr. Franchot Erichsen        Patient is medically stable for discharge.    Objective:     Visit Vitals  BP (!) 152/75   Pulse 87   Temp 98.8 F (37.1 C) (Oral)   Resp 18   Ht 5\' 3"  (1.6 m)   Wt 200 lb (90.7 kg)   SpO2 95%   BMI 35.43 kg/m              No intake or output data in the 24 hours ending 08/04/22 1448    Physical Exam:     GEN - AAOx3, NAD  HEENT mucous membranes moist  Neck - supple, no JVD  Cardiac - RRR, S1, S2, no murmurs  Chest/Lungs - clear to auscultation without wheezes or rhonchi  Abdomen - soft, nontender, nondistended, normal bowel sounds  Extremities - no clubbing/ cyanosis/ edema/surgical dressing on right leg  Neuro -follows commands    Current medications:       Current Facility-Administered Medications:     escitalopram (LEXAPRO) tablet 20 mg, 20  mg, Oral, Daily, Heyli Min, MD, 20 mg at 08/04/22 0843    oxyCODONE (ROXICODONE) immediate release tablet 5 mg, 5 mg, Oral, Q4H PRN, Myra Rude, MD, 5 mg at 08/04/22 1152    sodium chloride flush 0.9 % injection 5-40 mL, 5-40 mL, IntraVENous, 2 times per day, Myra Rude, MD, 10 mL at 08/04/22 0846    sodium chloride flush 0.9 % injection 5-40 mL, 5-40 mL, IntraVENous, PRN, Myra Rude, MD    0.9 % sodium chloride infusion, , IntraVENous, PRN, Myra Rude, MD    acetaminophen (TYLENOL) tablet 650 mg, 650 mg, Oral, Q4H PRN, Myra Rude, MD, 650 mg at 08/04/22 0654    HYDROmorphone (DILAUDID) injection 0.5 mg, 0.5 mg, IntraVENous, Q3H PRN, Myra Rude, MD, 0.5 mg at 08/03/22 1151    ondansetron (ZOFRAN-ODT) disintegrating tablet 4 mg, 4 mg, Oral, Q8H PRN **OR** ondansetron (ZOFRAN)  injection 4 mg, 4 mg, IntraVENous, Q6H PRN, Arita Miss, MD, 4 mg at 08/03/22 1151    sennosides-docusate sodium (SENOKOT-S) 8.6-50 MG tablet 1 tablet, 1 tablet, Oral, BID, Arita Miss, MD, 1 tablet at 08/04/22 6440    bisacodyl (DULCOLAX) EC tablet 5 mg, 5 mg, Oral, Daily PRN, Arita Miss, MD    polyethylene glycol [facility]) packet 17 g, 17 g, Oral, Daily PRN, Arita Miss, MD    aspirin EC tablet 325 mg, 325 mg, Oral, BID, Arita Miss, MD, 325 mg at 08/04/22 0842    dextrose 50 % IV solution, 10-50 mL, IntraVENous, PRN, Arita Miss, MD    glucagon (rDNA) injection 1 mg, 1 mg, IntraMUSCular, PRN, Arita Miss, MD    insulin glargine (LANTUS) injection vial 1-100 Units, 1-100 Units, SubCUTAneous, QHS, Arita Miss, MD, 39 Units at 08/03/22 2123    insulin lispro (HUMALOG) injection vial 1-100 Units, 1-100 Units, SubCUTAneous, 4x Daily AC & HS, Arita Miss, MD, 5 Units at 08/04/22 1155    insulin lispro (HUMALOG) injection vial 1-100 Units, 1-100 Units, SubCUTAneous, PRN, Arita Miss, MD    valsartan (DIOVAN) tablet 320 mg, 320 mg, Oral, Daily, 320 mg at 08/04/22 0842 **AND** hydroCHLOROthiazide (HYDRODIURIL) tablet 12.5  mg, 12.5 mg, Oral, Daily, Arita Miss, MD, 12.5 mg at 08/04/22 3474    senna (SENOKOT) tablet 8.6 mg, 1 tablet, Oral, Nightly, Arita Miss, MD, 8.6 mg at 08/03/22 2122    polyethylene glycol (GLYCOLAX) packet 17 g, 17 g, Oral, Daily, Arita Miss, MD, 17 g at 08/04/22 0846    omeprazole (PRILOSEC) delayed release capsule 20 mg, 20 mg, Oral, Daily, Arita Miss, MD, 20 mg at 08/04/22 2595    Current Outpatient Medications:     escitalopram (LEXAPRO) 20 MG tablet, Take 1 tablet by mouth daily, Disp: , Rfl:     aspirin 325 MG EC tablet, Take 1 tablet by mouth 2 times daily, Disp: 28 tablet, Rfl: 0    oxyCODONE (ROXICODONE) 5 MG immediate release tablet, Take 1 tablet by mouth every 6 hours as needed (severe pain) for up to 7 days. Max Daily Amount: 20 mg, Disp: 20 tablet, Rfl: 0    polyethylene glycol (GLYCOLAX) 17 g packet, Take 1 packet by mouth daily, Disp: 30 packet, Rfl: 0    omeprazole (PRILOSEC) 20 MG delayed release capsule, Take 1 capsule by mouth daily, Disp: 30 capsule, Rfl: 0    NOVOLOG RELION 100 UNIT/ML injection vial, USE UP TO 80 UNITS DAILY VIA INSULIN PUMP, Disp: , Rfl:     valsartan-hydroCHLOROthiazide (DIOVAN-HCT) 320-12.5 MG per tablet, Take 1 tablet by mouth daily, Disp: , Rfl:     Labs:     Recent Results (from the past 24 hour(s))   POCT Glucose    Collection Time: 08/03/22  4:26 PM   Result Value Ref Range    POC Glucose 307 (H) 65 - 105 mg/dL   POCT Glucose    Collection Time: 08/03/22  9:16 PM   Result Value Ref Range    POC Glucose 317 (H) 65 - 105 mg/dL   CBC with Auto Differential    Collection Time: 08/04/22  1:37 AM   Result Value Ref Range    WBC 9.4 4.0 - 11.0 1000/mm3    RBC 3.36 (L) 3.60 - 5.20 M/uL    Hemoglobin 9.1 (L) 11.0 - 16.0 gm/dl    Hematocrit 63.8 (L) 35.0 - 47.0 %  MCV 83.6 80.0 - 98.0 fL    MCH 27.1 25.4 - 34.6 pg    MCHC 32.4 30.0 - 36.0 gm/dl    Platelets 454236 098140 - 450 1000/mm3    MPV 10.2 (H) 6.0 - 10.0 fL    RDW 40.0 36.4 - 46.3      Nucleated RBCs 0 0 - 0       Immature Granulocytes 0.3 0.0 - 3.0 %    Neutrophils Segmented 64.8 (H) 34 - 64 %    Lymphocytes 23.4 (L) 28 - 48 %    Monocytes 9.6 1 - 13 %    Eosinophils 1.3 0 - 5 %    Basophils 0.6 0 - 3 %   Basic Metabolic Panel    Collection Time: 08/04/22  1:37 AM   Result Value Ref Range    Potassium 3.8 3.5 - 5.1 mEq/L    Chloride 103 98 - 107 mEq/L    Sodium 138 136 - 145 mEq/L    CO2 28 20 - 31 mEq/L    Glucose 300 (H) 74 - 106 mg/dl    BUN 9 9 - 23 mg/dl    Creatinine 1.190.90 1.470.55 - 1.02 mg/dl    GFR African American >60.0      GFR Non-African American >60      Calcium 9.6 8.7 - 10.4 mg/dl    Anion Gap 7 5 - 15 mmol/L   POCT Glucose    Collection Time: 08/04/22  6:53 AM   Result Value Ref Range    POC Glucose 354 (H) 65 - 105 mg/dL   POCT Glucose    Collection Time: 08/04/22 11:36 AM   Result Value Ref Range    POC Glucose 299 (H) 65 - 105 mg/dL       RAD Results:  FL LESS THAN 1 HOUR    Result Date: 08/02/2022  Indication: ORIF right tibia and fibula     IMPRESSION: 3 fluoroscopic images obtained. 2 minutes 21 seconds fluoroscopy time used showing extensive hardware at the proximal tibia. Electronically signed by: Darlina Rumpfavid Cohen, MD 08/02/2022 4:49 PM EDT          Workstation ID: [id number]     XR CHEST PORTABLE    Result Date: 08/01/2022  Clinical history: Tibial fracture, preop EXAMINATION: AP view of the chest 08/01/2022 Correlation: None FINDINGS: Trachea and heart size are within normal limits. Lungs are clear.     IMPRESSION: No acute pulmonary process. Electronically signed by: Jolayne PantherSanjay Patel, MD 08/01/2022 6:05 PM EDT          Workstation ID: [id number]     XR TIBIA FIBULA RIGHT (2 VIEWS)    Result Date: 08/01/2022  Clinical history: Fall EXAMINATION: 2 views right tibia and fibula FINDINGS: There is a comminuted fracture of the medial and lateral tibial plateau. No fracture of the fibula. Lipohemarthrosis.     IMPRESSION: Comminuted medial and lateral tibial plateau fracture. Electronically signed by: Jolayne PantherSanjay Patel, MD  08/01/2022 4:09 PM EDT          Workstation ID: [id number]     XR KNEE RIGHT (MIN 4 VIEWS)    Result Date: 08/01/2022  Clinical history: Fall, pain and swelling EXAMINATION: 4 views right knee FINDINGS: There is small joint effusion. No fracture of the distal femur or patella. There is a comminuted fracture of the medial and lateral tibial plateau with angulation. No definitive fracture of the proximal fibula.     IMPRESSION: Comminuted fracture medial and lateral  tibial plateau. Electronically signed by: Jolayne Panther, MD 08/01/2022 4:07 PM EDT          Workstation ID: [id number]         Total clinical care time was 38   minutes of which more than 50% was spent in coordination of care and counseling (time spent with patient/family face to face, physical exam, reviewing laboratory and imaging investigations, speaking with physicians and nursing staff involved in this patient's care).     Dragon medical dictation system was used for part of this note. Unintentional voice recognition errors may occur    Tia Masker,  MD  [facility]  August 04, 2022  Time: 2:48 PM

## 2022-08-04 NOTE — Progress Notes (Signed)
OCCUPATIONAL THERAPY TREATMENT    Time  OT Charge Capture  Rehab Caseload Tracker  -    Patient: Michelle Herring (56 y.o. female)  Room: 2118/2118    Primary Diagnosis: Tibial plateau fracture, right, closed, initial encounter [S82.141A]   Procedure(s) (LRB):  OPEN REDUCTION INTERNAL FIXATION TIBIAL PLATEAU (Right) 2 Days Post-Op  Date of Admission: 08/01/2022   Length of Stay:  3 day(s)  Insurance: Payor: GENERIC COMMERCIAL / Plan: GENERIC COMMERCIAL / Product Type: Indemnity /      Date: 08/04/2022  Time In: 0847        Time Out: 0941       Total Minutes: 7       Isolation:  No active isolations       MDRO: No active infections    Precautions: falls,      Ordered weight bearing status: non weight bearing RIGHT lower extremity *Dr present educated pt to wear knee immobilizer when OOB.     Current diet order: ADULT DIET; Regular; 4 carb choices (60 gm/meal)    ASSESSMENT     Based on the objective data described below, the patient presents with       -Need for Supervision when OOB to maintain WB.   -Able to don brace at bed level with Sup/ cues to align cut out with knee  -Ed to request Sup to restroom- pt demonstrated backing into restroom with FWW- educated pt to go into restroom head on with FWW, stay with FWW all through transfer  -Pt progressing with POC, standing x at sink level demonstrating TTWB - needing cues to lift foot from floor      Recommendations:  Recommend continued skilled occupational therapy intervention to address above impairments.  Recommend out of bed activity to counteract ill effects of bedrest, with assistance from staff as needed.    Discharge Recommendations:   -  Patient plans on returning home with family    Equipment Recommendations for Discharge:   -  no needs anticipated; has DME available at home     PLAN     Continue established OT POC    COMMUNICATION/EDUCATION     Barriers to learning/limitations: None    Education provided MV:HQIONGE, spouse on (+) role of OT, (+) OT plan  of care, (+) the importance of maintaining UE muscle strength and activity tolerance while hospitalized to prevent a decline in function, (+) ADL training, (+) home safety, bathroom safety, functional mobility    Educational handouts issued: home safety booklet: bathroom safety, home safety, picture of safe bathroom & sitting area, reacher and sock aid use.     Patient / family response to education: verbalized and demonstrated understanding    SUBJECTIVE     Patient "oh Donnald Garre been getting up and taking myself to the bathroom".    Pain assessment: 4 / 10, location: R LE    OBJECTIVE DATA SUMMARY     Orders, labs, occupational therapy and chart reviewed on [place]. Communicated with nursing staff. Patient cleared to participate in Occupational Therapy treatment.    PATIENT FOUND     Seated EOB, husband in room    COGNITIVE STATUS     Pt alert and oriented, able to follow commands, needs cues for safety    ACTIVITES OF DAILY LIVING     LB dressing- Sup with use of reacher and socks aid to don/doff socks, and cues for correct application of knee immobilizer  Toileting- Sup/CGA pt demonstrated method  of backing into bathroom with FWW. Educated pt that to increase safety, pt should cross threshold forward and then turn in small space in front of commode. Educated pt on use of alt unilateral support to allow for increased safety with clothing management  Oral care- SBA/SUP pt able to stand 10 mins, during oral care/ self care tasks at sink, initially required cues as pt demo TTWB    MOBILITY     -  Supine to sit -  modified independent  -  Sit to stand -  supervision, cues to kick leg out before standing- pt was not initially aware that immobilizer was required when OOB.   -  Functional ambulation in room: supervision, rolling walker, gait belt  *Pt reported had been using leg lifter held on FWW under R hand to assist keeping foot off of floor. Educated pt that that was an unsafe practice that could torque RLE. Pt  verbalized understanding  Transfers:  -  Bed to commode: supervision, contact guard assistance, rolling walker, gait belt    BALANCE AND ACTIVITY TOLERANCE     Balance:  -  Static sitting balance:  good  -  Dynamic sitting balance:  good  -  Static standing balance:  fair and fair-  -  Dynamic standing balance:  fair-  -  Functional standing tolerance:  10 minutes    Activity Tolerance:   - good tolerance to activity during OT session  - motivated to increase activity    Vitals:   Not assessed, non symptomatic during session    THERAPEUTIC EXERCISE/ACTIVITY     Therapeutic Exercises:   Educated pt on performance of depression lifts with RLE extended, with pt seated in arm chair or at EOB 3 x 5 reps to increase BUE strength and I with ADL performance.     Therapeutic Activity:  Issued home safety handout and educated pt on benefits of tub transfer bench to allow for increased safety and adherence to NWB during showering     FINAL LOCATION     Patient positioned in bed, all needs within reach    Marcha Solders, OTA  August 04, 2022

## 2024-05-22 IMAGING — DX XR CHEST 1 VIEW
1 series · 1 of 1 positions shown · non-contrast
Comparison: None

Sob, weakness, altered mental status
FINAL REPORT:
Chest portable one view
INDICATION: sob

[AP]
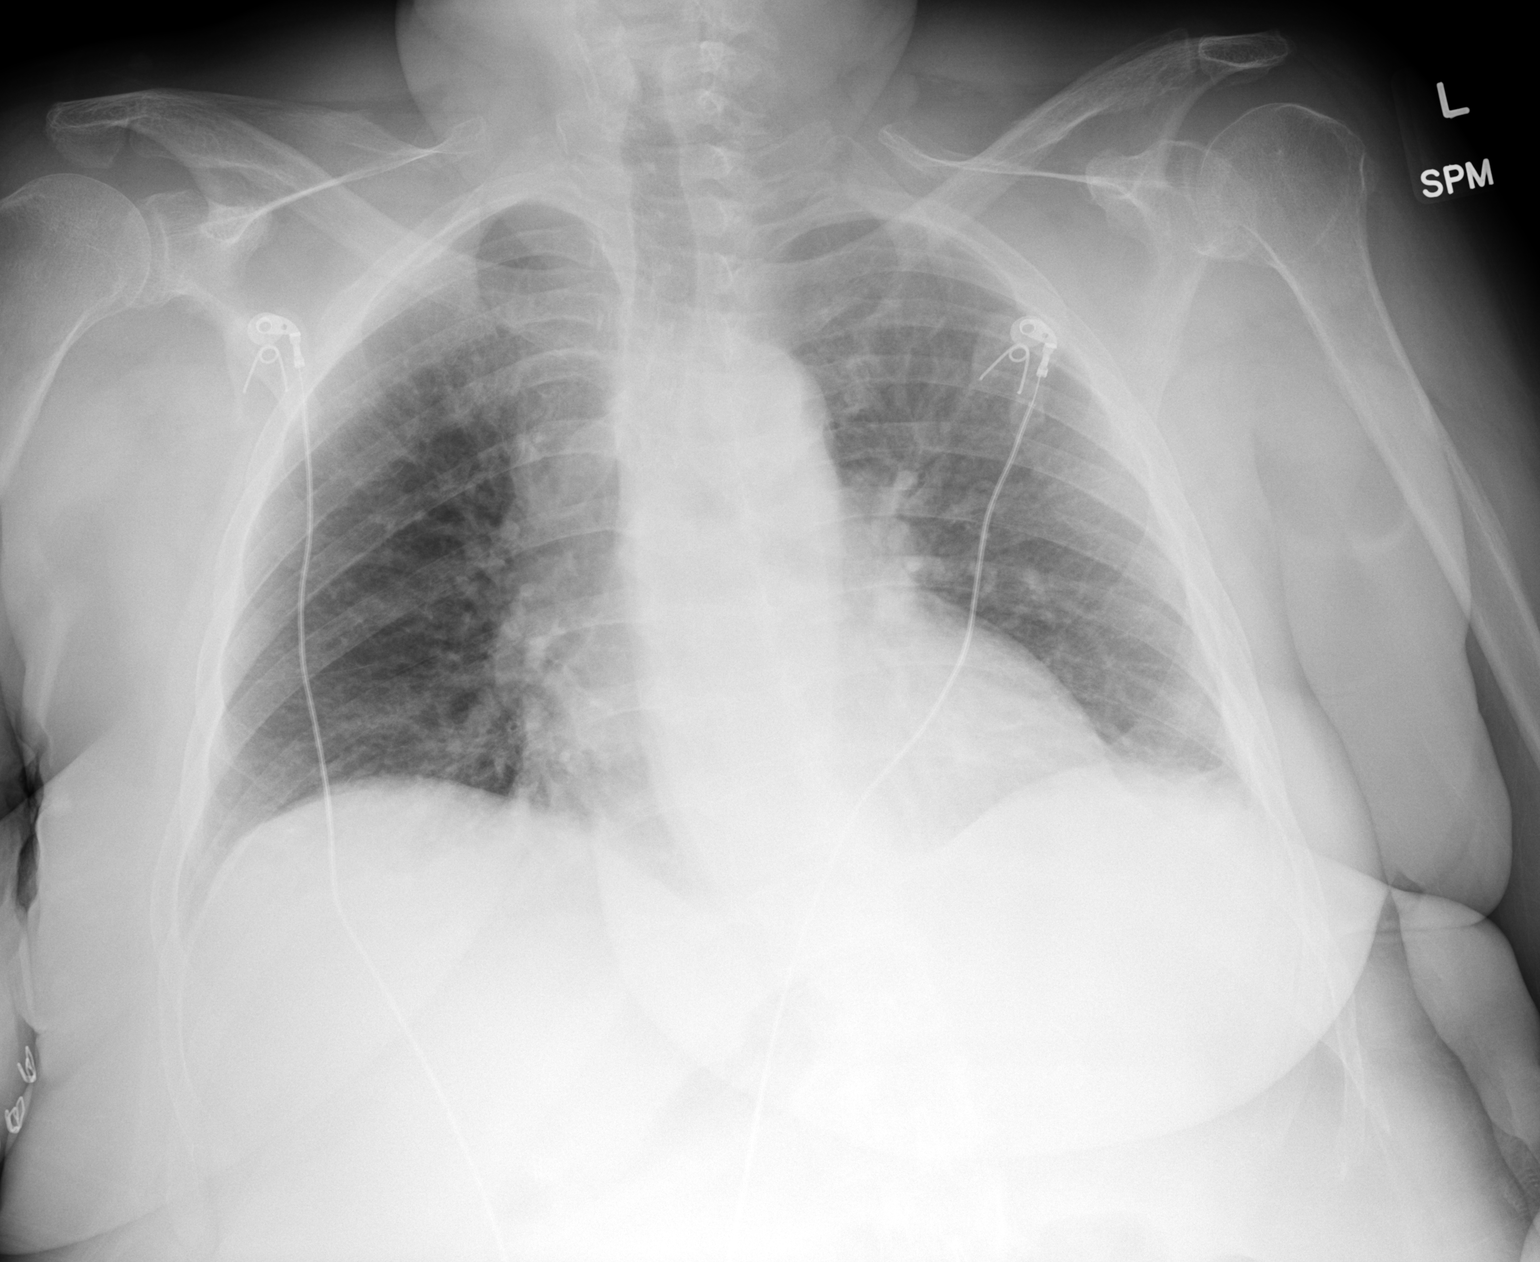

[1 of 1 positions shown; findings below may reference images not displayed]

FINDINGS: Degenerative changes in the bilateral shoulders. The cardiac silhouette is within normal limits   given portable technique. Increased interstitial markings seen throughout the bilateral lungs. Hazy opacity at the left lung base. No pneumothorax. Small left pleural effusion. No acute osseous abnormalities.
IMPRESSION: 
IMPRESSION: Increased interstitial markings throughout the bilateral lungs could represent atypical pneumonia and/or pulmonary edema.
Small left pleural effusion. There is overlying compressive atelectasis versus infiltrate.
Portable?
Yes
Is the patient pregnant?
No

## 2024-05-22 IMAGING — CT CT CHEST WITHOUT CONTRAST
3 of 9 series · 15 of 36 positions shown, 18 images · non-contrast
Comparison: None
COMPARISON: None

------------- REPORT [id number] -------------
AMS, FOUND SLEEPING IN CAR
FINAL REPORT:
EXAM: Chest CT without contrast.
HISTORY: Respiratory illness, nondiagnostic xray
TECHNIQUE: Axial images were obtained from the lung apices through the upper
abdomen.  No intravenous contrast. Sagittal and coronal reformatted images were
generated.
All CT scans at this facility use dose modulation and/or weight based dosing
when appropriate to reduce radiation dose to as low as reasonably achievable.

[Series 4: retro · axial · 0.49mm/px · z∈[+22,+52]mm · 2 of 256 slices shown]
[im 43/256  lung]
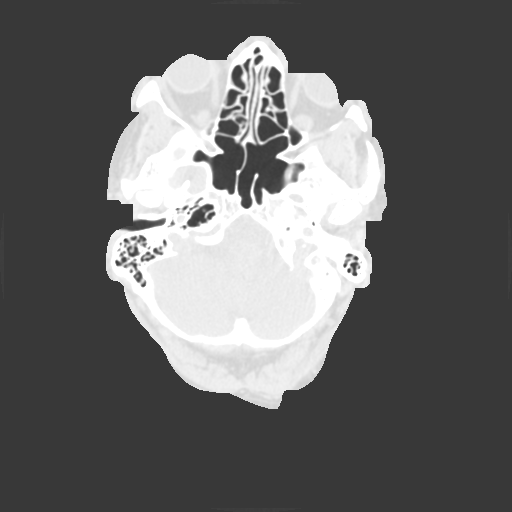
[im 86/256  lung]
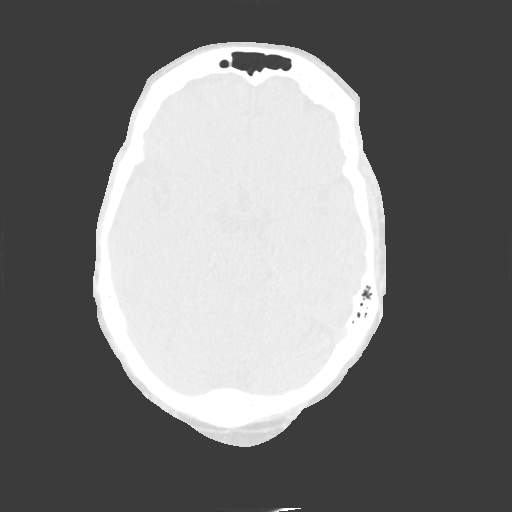

[Series 7: thins · axial · 0.77mm/px · z∈[-419,-140]mm · 12 of 528 slices shown, 15 images]
[im 41/528  mediastinal]
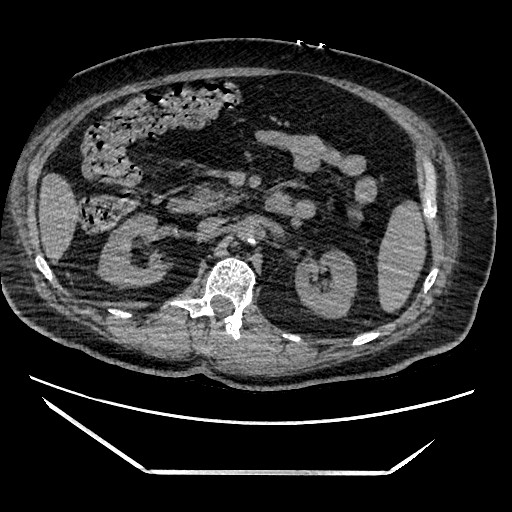
[im 41/528  lung]
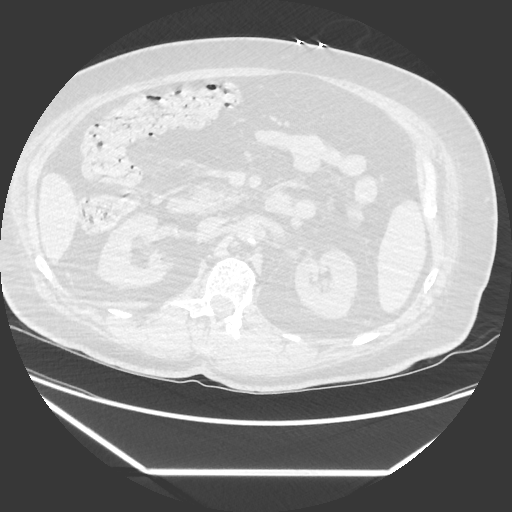
[im 82/528  lung]
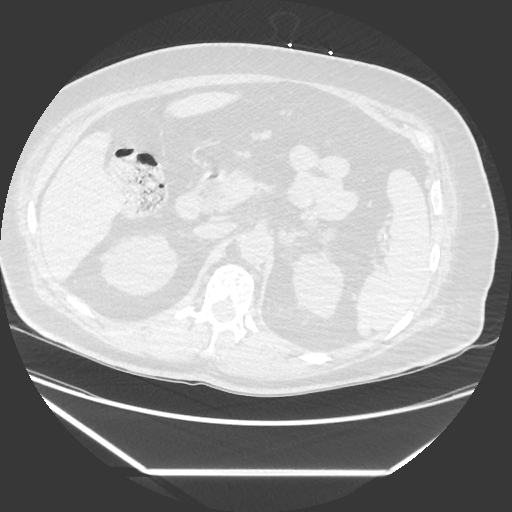
[im 122/528  lung]
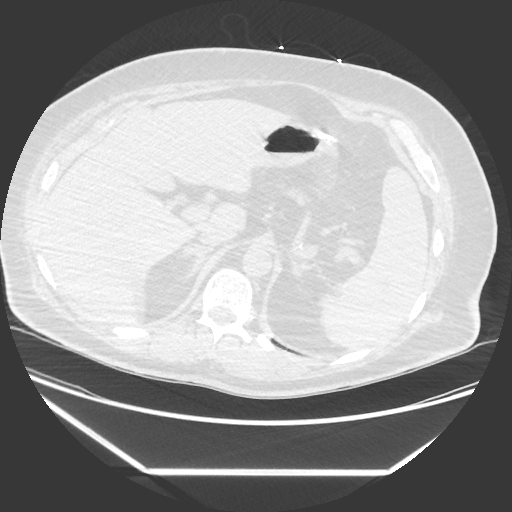
[im 163/528  lung]
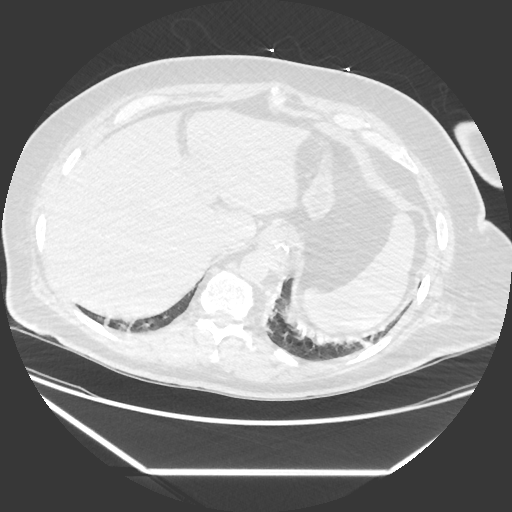
[im 203/528  mediastinal]
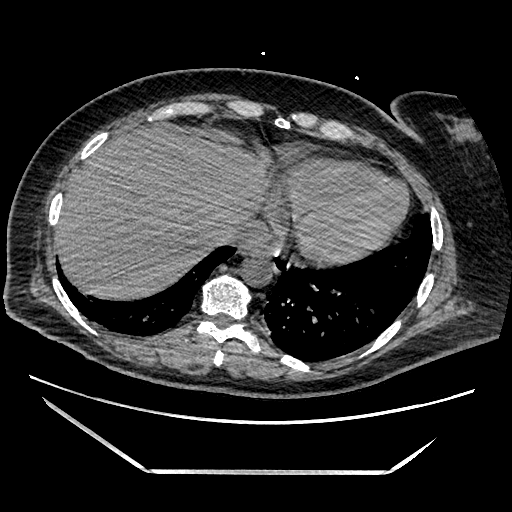
[im 203/528  lung]
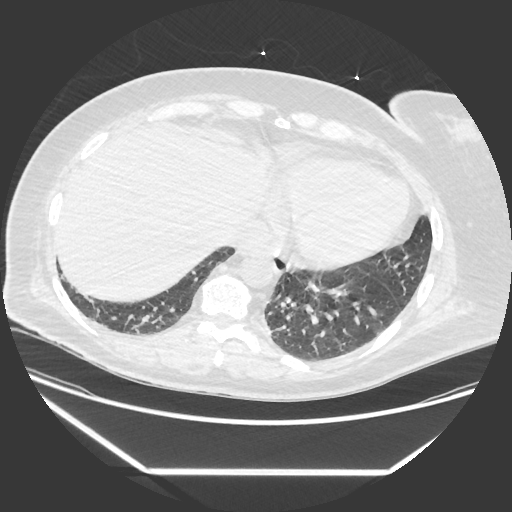
[im 244/528  lung]
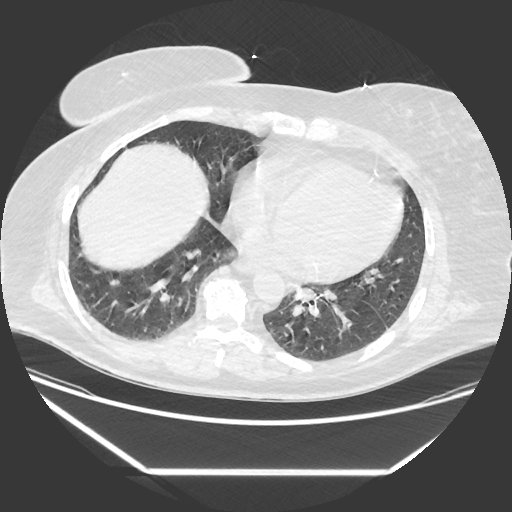
[im 284/528  lung]
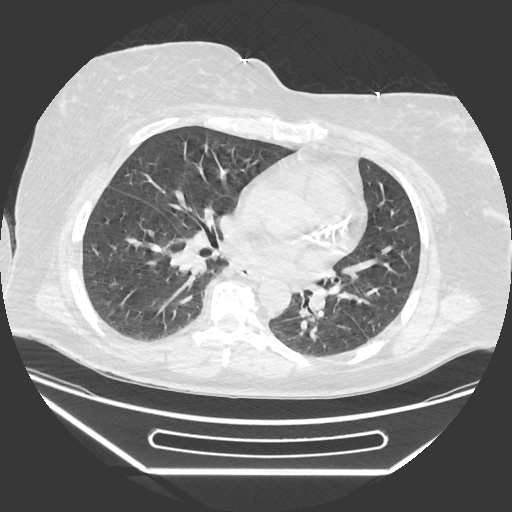
[im 325/528  lung]
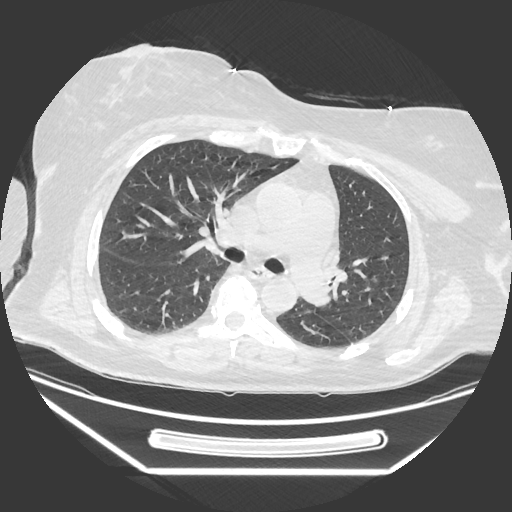
[im 365/528  mediastinal]
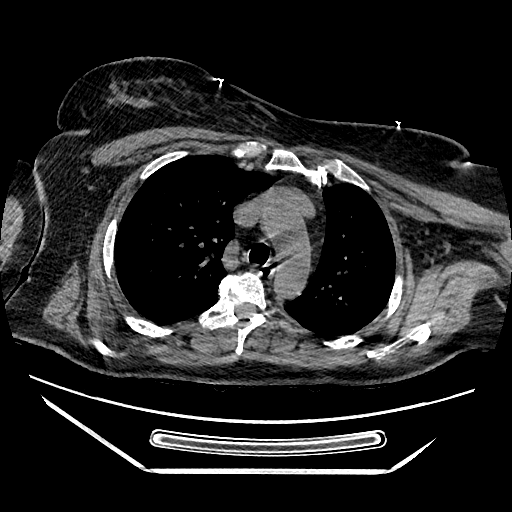
[im 365/528  lung]
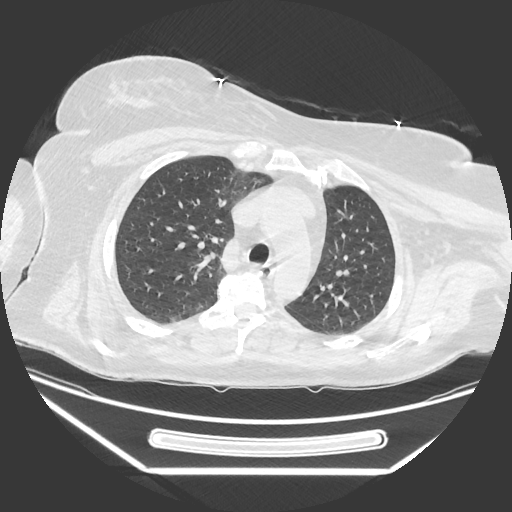
[im 406/528  lung]
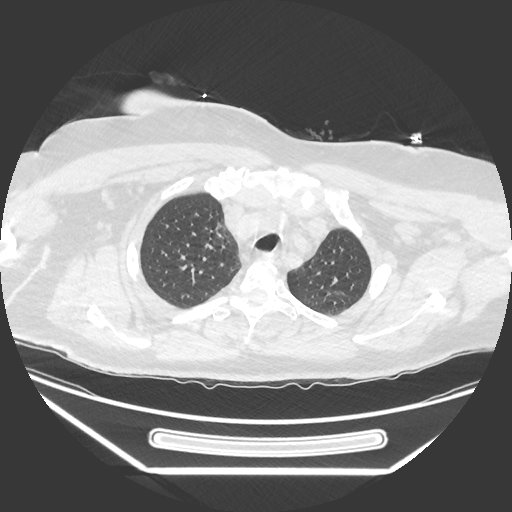
[im 446/528  lung]
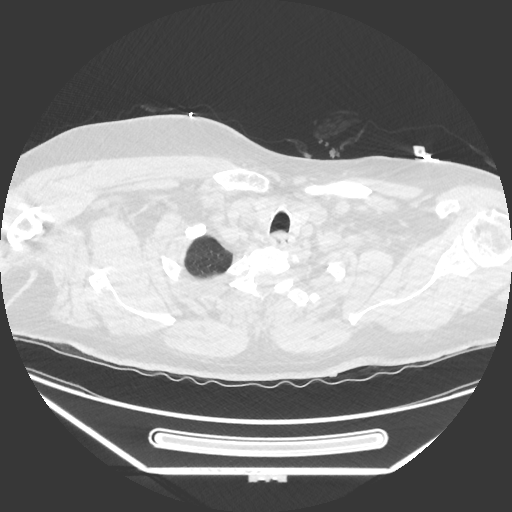
[im 487/528  lung]
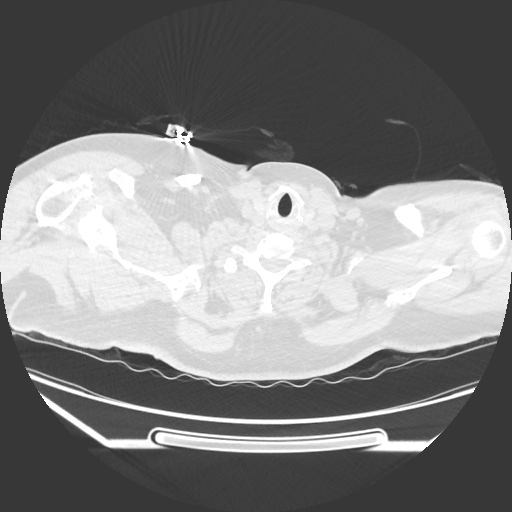

[Series 604: sag standard 2x2 · sagittal · 0.77mm/px · 1 of 199 slices shown]
[im 100/199  lung]
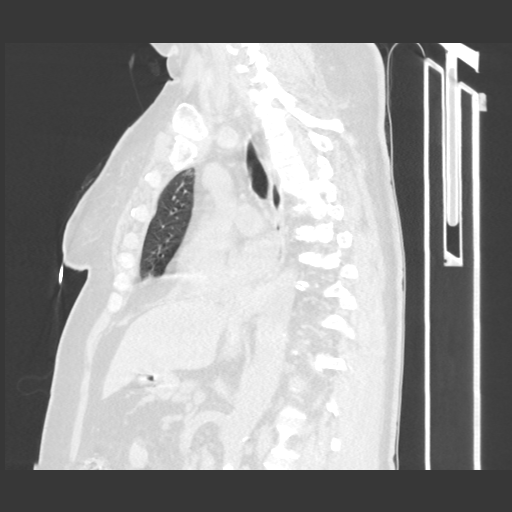

[15 of 36 positions shown; findings below may reference images not displayed]

FINDINGS: Motion-degraded study. Study is limited by artifact from patient's arms being
down.
Supraclavicular region: Unremarkable.
Thoracic lymph nodes: No pathologically enlarged mediastinal or axillary lymph
node is visualized. Hilar regions are difficult to evaluate given the lack of
intravenous contrast.
Heart and vessels: Heart size is normal. No pericardial effusion. Thoracic
aortic and coronary arterial calcifications
Lungs: Evaluation of the lungs is limited by respiratory motion degradation.
Atelectasis in the lung bases. No suspicious lung nodule is identified
Other findings: Sleeve gastrectomy. Small hiatal hernia. Rim calcified focus in
the region of the distal splenic artery measuring 0.8 x 1 cm, likely a calcified
aneurysm.
Bones are demineralized. Dextroconvex curvature of the thoracic spine.
Degenerative changes of the spine.
IMPRESSION: No acute intrathoracic abnormality
[DATE]
Is the patient pregnant?
No

------------- REPORT [id number] -------------
AMS, FOUND SLEEPING IN CAR
FINAL REPORT:
EXAM: Chest CT without contrast.
FINDINGS: Motion-degraded study. Study is limited by artifact from patient's arms being
down.
Supraclavicular region: Unremarkable.
Thoracic lymph nodes: No pathologically enlarged mediastinal or axillary lymph
node is visualized. Hilar regions are difficult to evaluate given the lack of
intravenous contrast.
Heart and vessels: Heart size is normal. No pericardial effusion. Thoracic
aortic and coronary arterial calcifications
Lungs: Evaluation of the lungs is limited by respiratory motion degradation.
Atelectasis in the lung bases. No suspicious lung nodule is identified
Other findings: Sleeve gastrectomy. Small hiatal hernia. Rim calcified focus in
the region of the distal splenic artery measuring 0.8 x 1 cm, likely a calcified
aneurysm.
Bones are demineralized. Dextroconvex curvature of the thoracic spine.
Degenerative changes of the spine.
IMPRESSION: No acute intrathoracic abnormality
[DATE]
Is the patient pregnant?
No

## 2024-05-23 IMAGING — MR MRI BRAIN WITHOUT CONTRAST
12 series · 38 of 48 positions shown · non-contrast
Comparison: none

------------- REPORT [id number] -------------
FINAL REPORT:
HISTORY: CVA.
MRI brain without contrast.
TECHNIQUE: Multiplanar-multisequence MR imaging obtained of the brain without contrast.

[Series 1001: survey · sagittal · 1.6mm · 1.62mm/px · 8 of 128 slices shown]
[im 9/128]
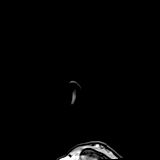
[im 26/128]
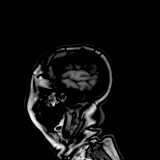
[im 43/128]
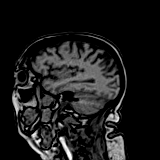
[im 60/128]
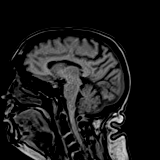
[im 77/128]
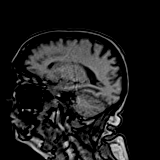
[im 94/128]
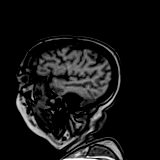
[im 111/128]
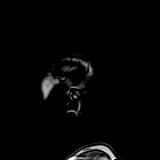
[im 128/128]
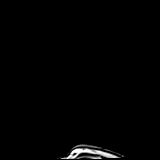

[Series 2001: survey_mpr_sag · oblique · 1.6mm · 1.60mm/px · 1 of 5 slices shown]
[im 1/5]
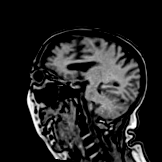

[Series 3001: survey_mpr_cor · coronal · 1.6mm · 1.60mm/px · 1 of 3 slices shown]
[im 1/3]
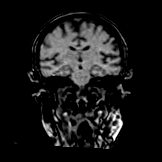

[Series 4001: survey_mpr_tra · axial · 1.6mm · 1.60mm/px · 1 of 3 slices shown]
[im 1/3]
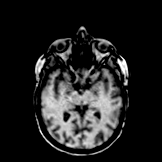

[Series 5001: T1 · sagittal · 5.0mm · 0.40mm/px · 3 of 27 slices shown (1 of 2)]
[im 1/27]
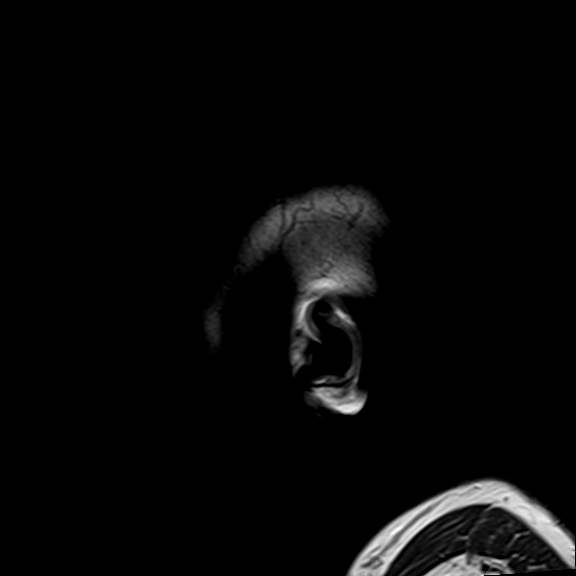
[im 14/27]
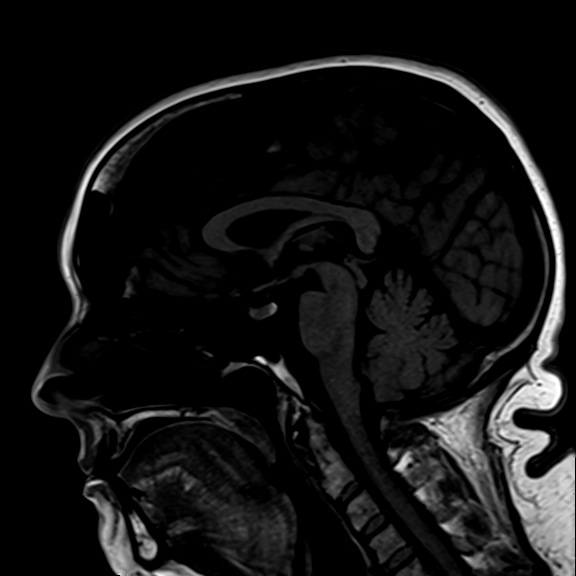
[im 27/27]
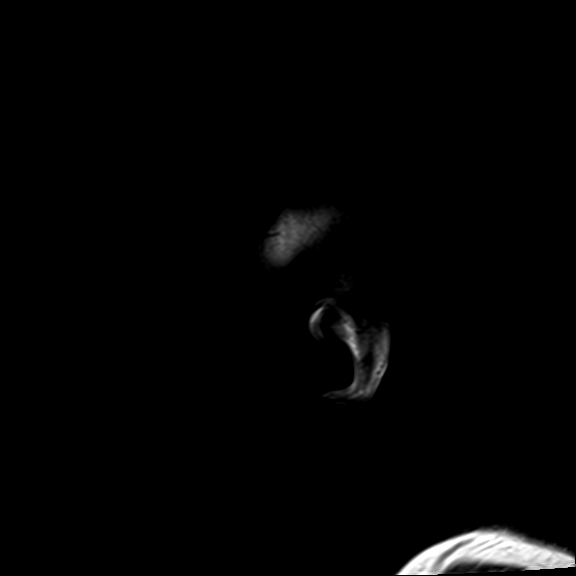

[Series 6002: ax dwi_tracew · axial · 5.0mm · 0.60mm/px · z∈[-66,+93]mm · 3 of 28 slices shown]
[im 1/28]
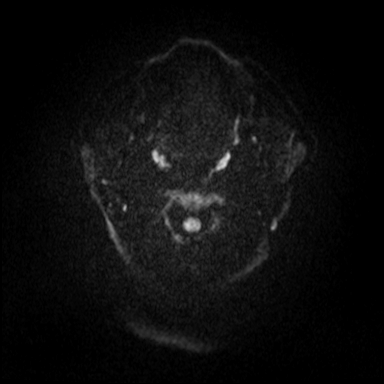
[im 14/28]
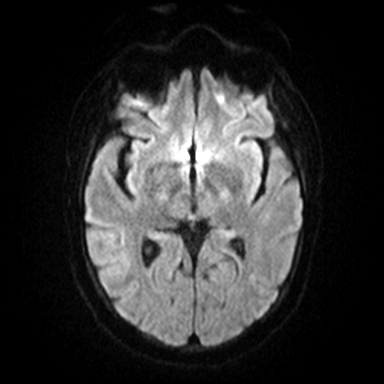
[im 28/28]
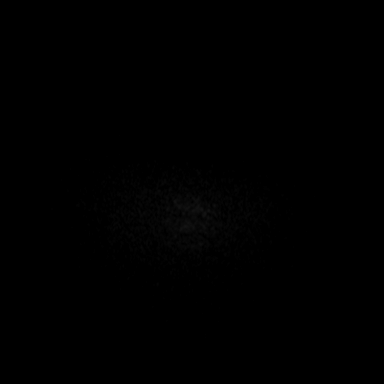

[Series 7001: ax dwi_adc · axial · 5.0mm · 0.60mm/px · z∈[-66,+93]mm · 3 of 28 slices shown]
[im 1/28]
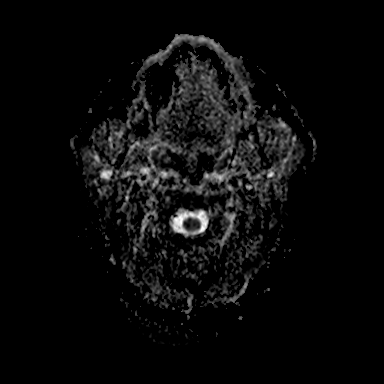
[im 14/28]
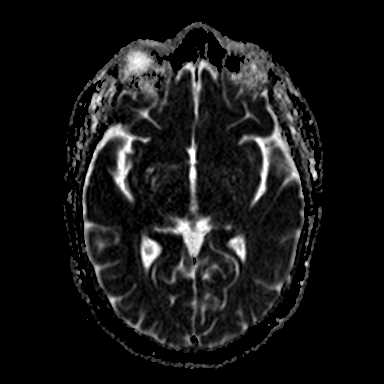
[im 28/28]
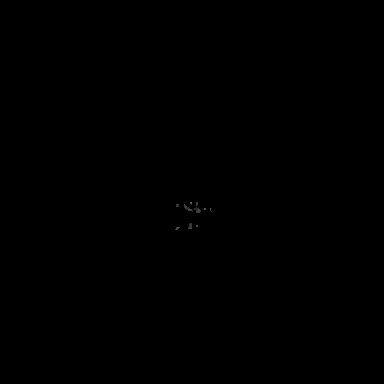

[Series 8001: ax dwi_exp · axial · 5.0mm · 0.60mm/px · z∈[-66,-13]mm · 2 of 28 slices shown]
[im 1/28]
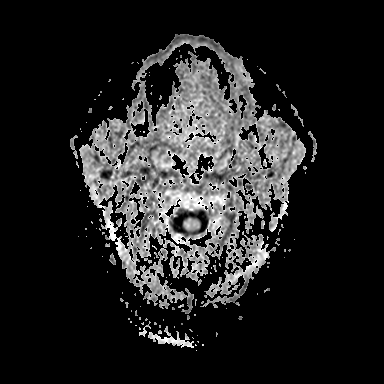
[im 10/28]
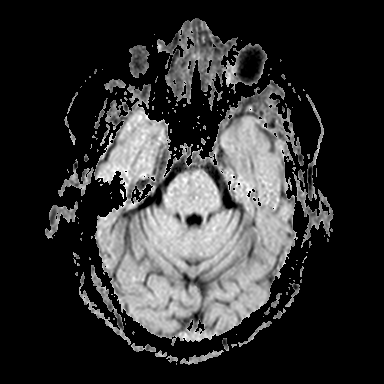

[Series 9001: T2 · axial · 5.0mm · 0.51mm/px · z∈[-64,+95]mm · 4 of 28 slices shown]
[im 1/28]
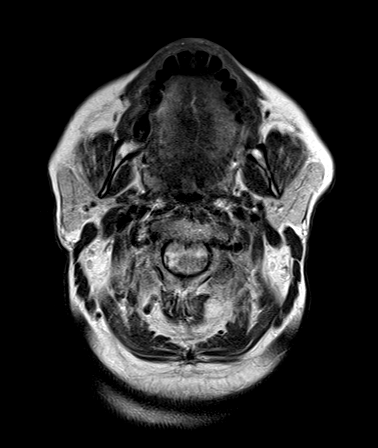
[im 10/28]
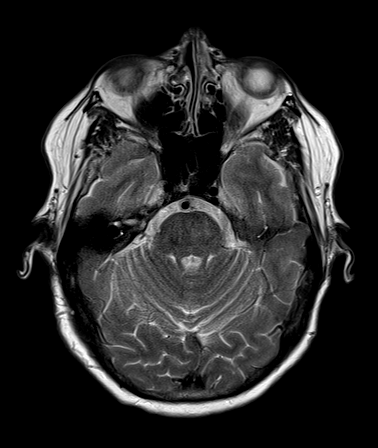
[im 19/28]
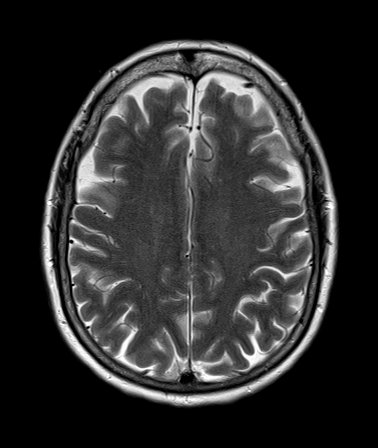
[im 28/28]
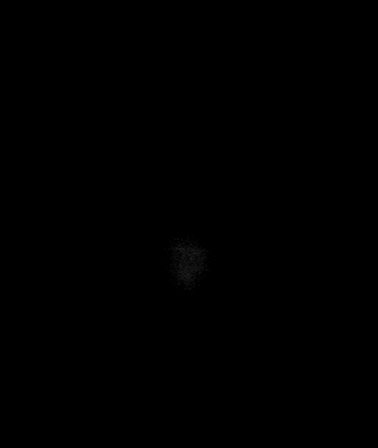

[T1 · axial · 5.0mm · 0.72mm/px · z∈[-64,+95]mm · 4 of 28 slices shown (2 of 2)]
[im 1/28]
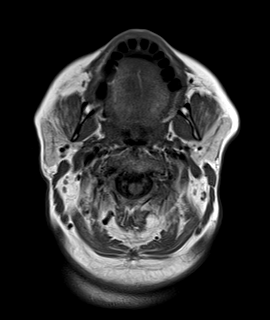
[im 10/28]
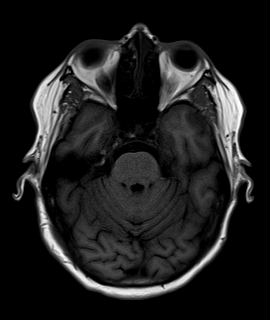
[im 19/28]
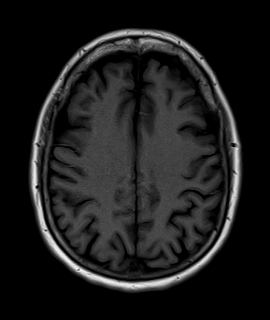
[im 28/28]
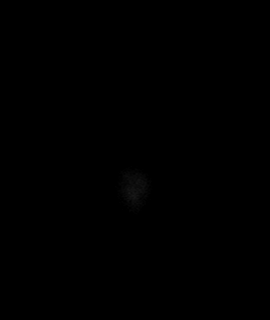

[FLAIR · axial · 5.0mm · 0.72mm/px · z∈[-64,+95]mm · 4 of 28 slices shown]
[im 1/28]
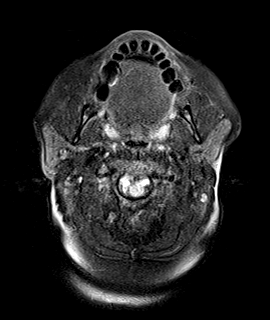
[im 10/28]
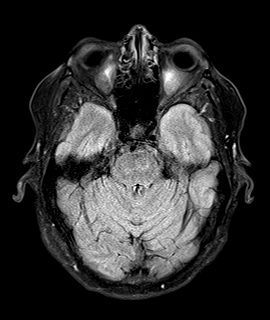
[im 19/28]
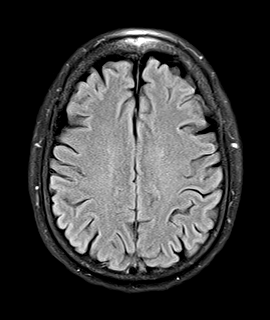
[im 28/28]
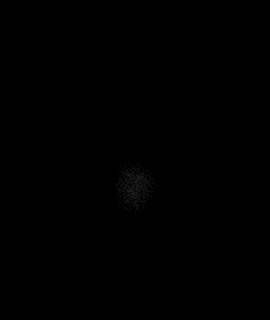

[GRE · axial · 5.0mm · 0.45mm/px · z∈[-64,+95]mm · 4 of 28 slices shown]
[im 1/28]
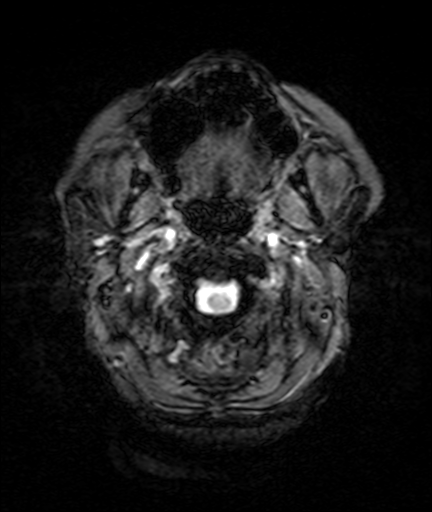
[im 10/28]
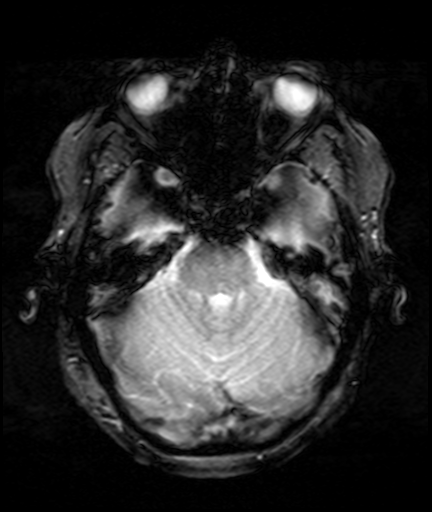
[im 19/28]
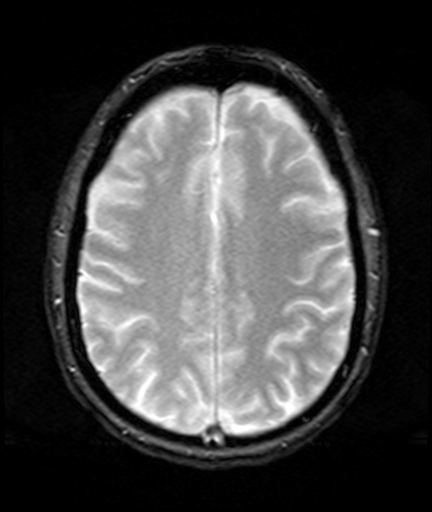
[im 28/28]
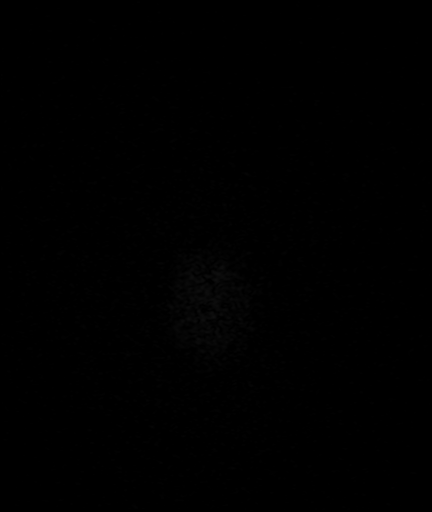

[38 of 48 positions shown; findings below may reference images not displayed]

FINDINGS: Prior: CT head 03/05/2025.
No restricted diffusion or associated ADC mapping deficit to suggest acute or early subacute infarction. No intracranial hemorrhage. Small remote lacunar type infarcts in the right basal ganglia. No additional cerebral or cerebellar signal abnormality, mass or mass effect, midline shift, or extra-axial fluid collection. Mild intracranial atrophy. No hydrocephalus. The larger intracranial flow voids are visualized. The basilar cisterns and aqueducts are maintained. The midline and sellar structures are preserved. Bone marrow signal intensity is maintained in the skull base and calvarium.
Included portions of the orbits, paranasal sinuses, and mastoids are clear.
IMPRESSION: 1. No acute intracranial abnormality.
2. Small remote lacunar type infarct right basal ganglia.
3. Mild intracranial atrophy.
Is the patient pregnant?
Unknown

------------- REPORT [id number] -------------
FINAL REPORT:
FINDINGS: Prior: CT head 03/05/2025.
No restricted diffusion or associated ADC mapping deficit to suggest acute or early subacute infarction. No intracranial hemorrhage. Small remote lacunar type infarcts in the right basal ganglia. No additional cerebral or cerebellar signal abnormality, mass or mass effect, midline shift, or extra-axial fluid collection. Mild intracranial atrophy. No hydrocephalus. The larger intracranial flow voids are visualized. The basilar cisterns and aqueducts are maintained. The midline and sellar structures are preserved. Bone marrow signal intensity is maintained in the skull base and calvarium.
Included portions of the orbits, paranasal sinuses, and mastoids are clear.
IMPRESSION: 1. No acute intracranial abnormality.
2. Small remote lacunar type infarct right basal ganglia.
3. Mild intracranial atrophy.
Is the patient pregnant?
Unknown

## 2024-05-23 IMAGING — US US LOW EXT VEINS BILAT
1 series · 13 of 16 positions shown · non-contrast
Comparison: None

FINAL REPORT:
US LOW EXT VEINS BILAT
INDICATION: Leg deep vein thrombosis (DVT) suspected. Bilateral lower extremity
swelling.
TECHNIQUE: Ultrasound examination of the Bilateral lower extremity veins was
performed using grayscale compression where possible, color, and spectral
Doppler technique.

[Series 1: us low ext veins bilat · 13 of 49 slices shown]
[im 1/49]
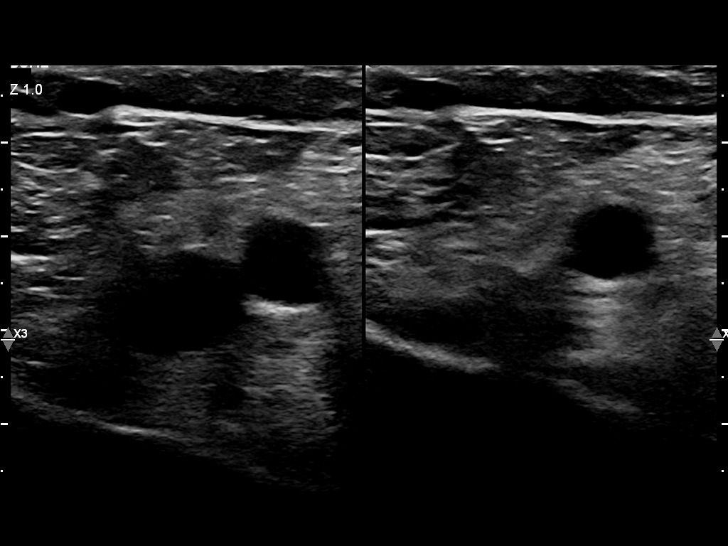
[im 4/49]
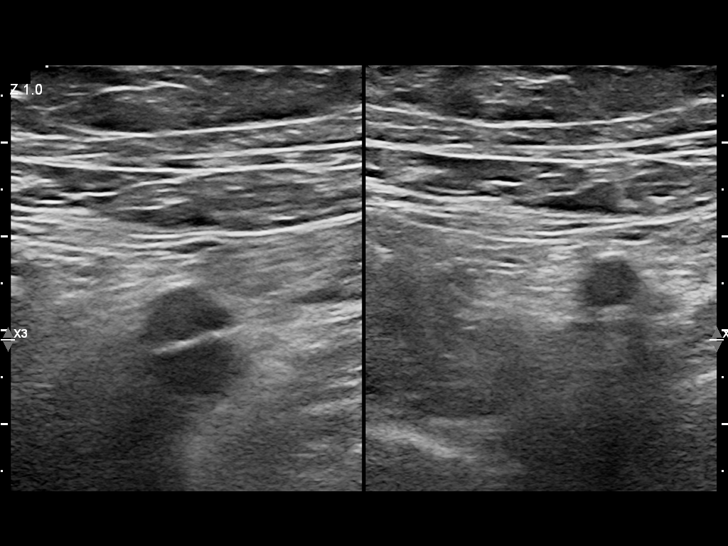
[im 10/49]
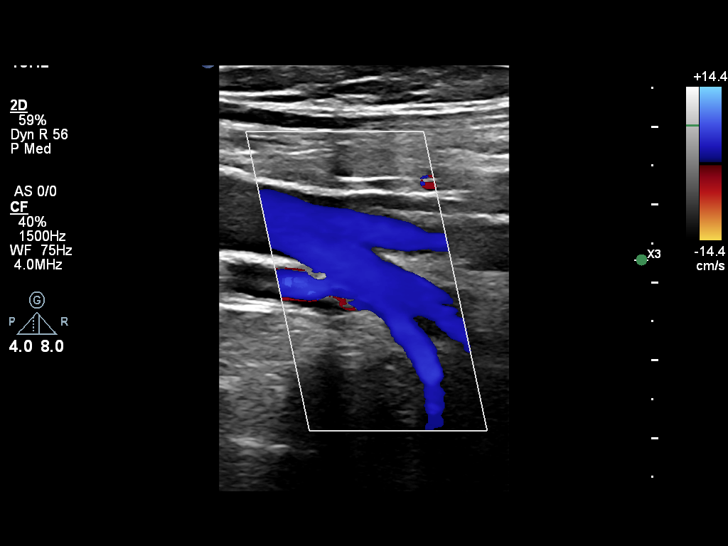
[im 13/49]
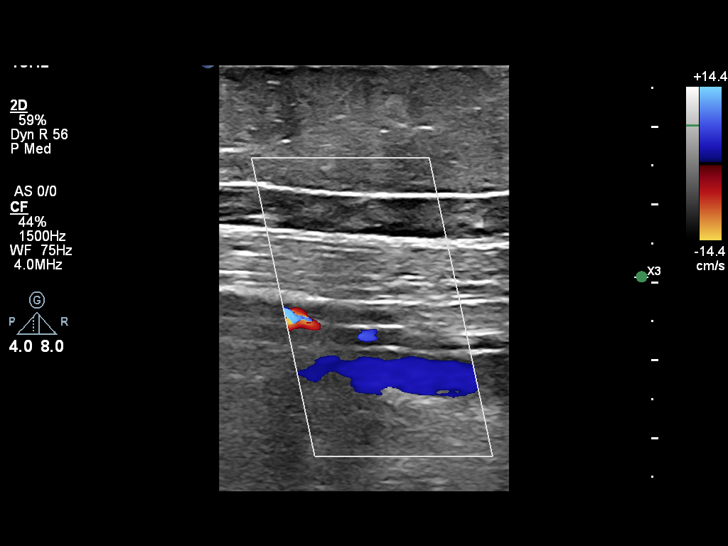
[im 17/49]
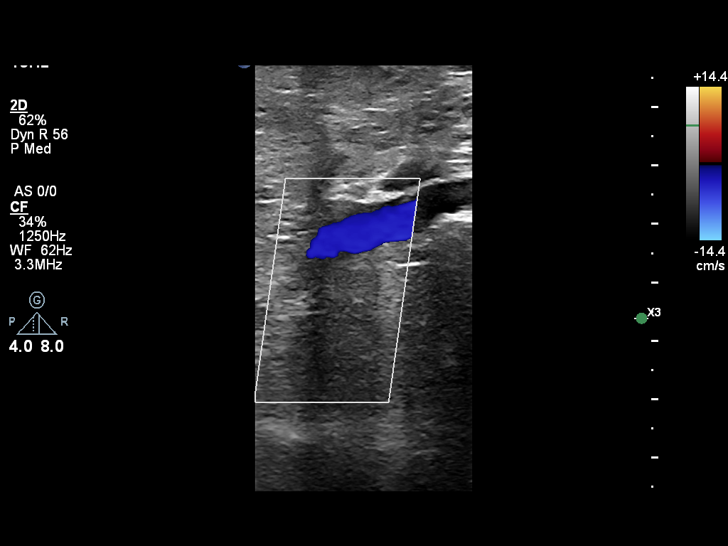
[im 20/49]
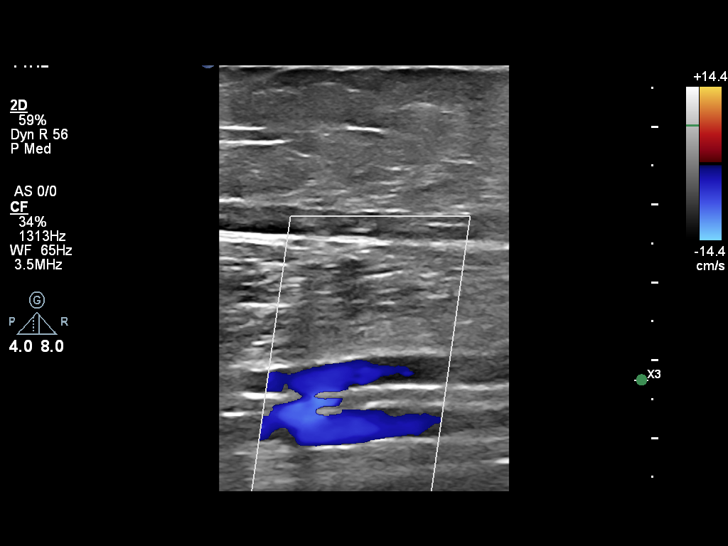
[im 26/49]
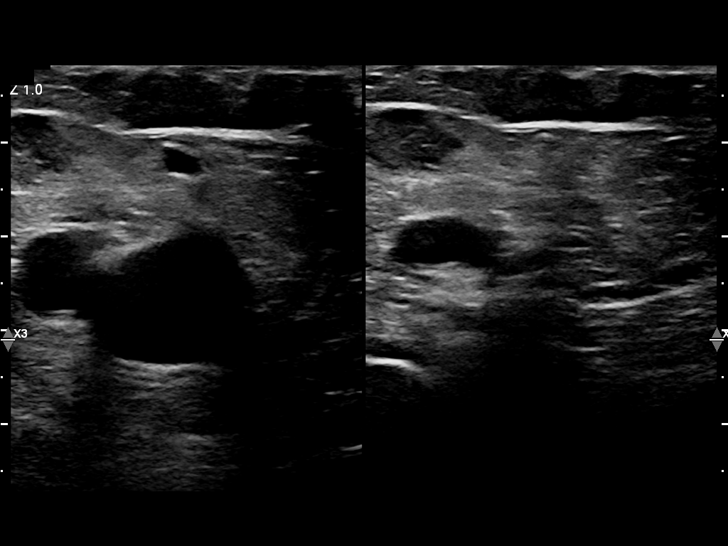
[im 29/49]
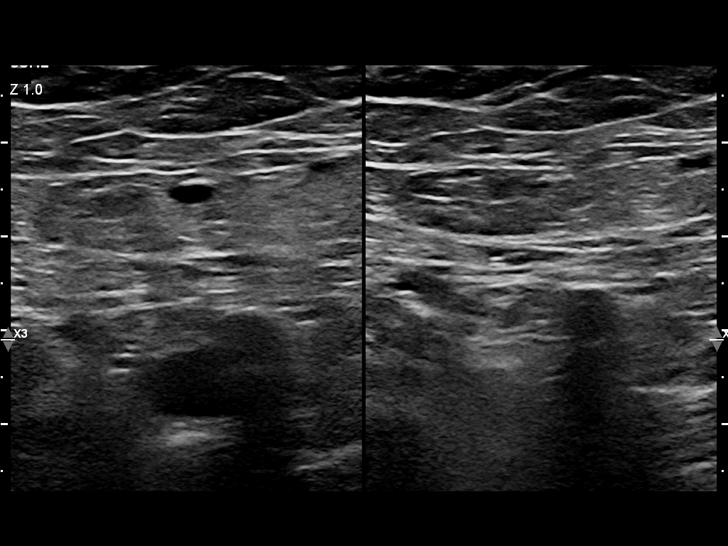
[im 33/49]
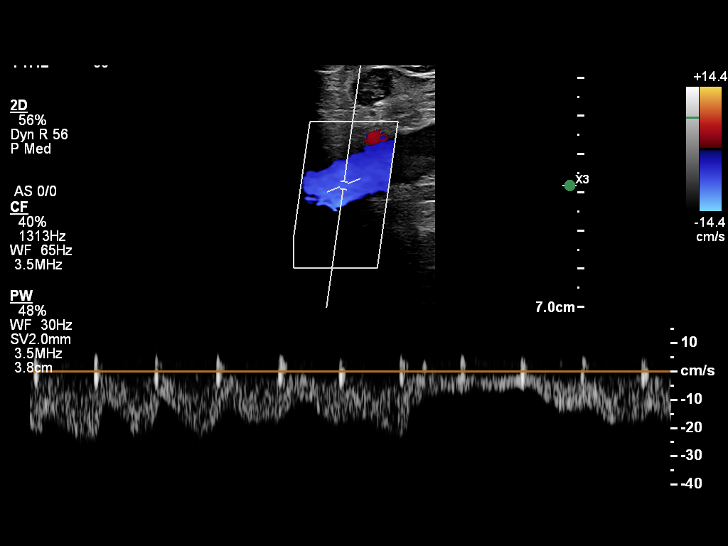
[im 36/49]
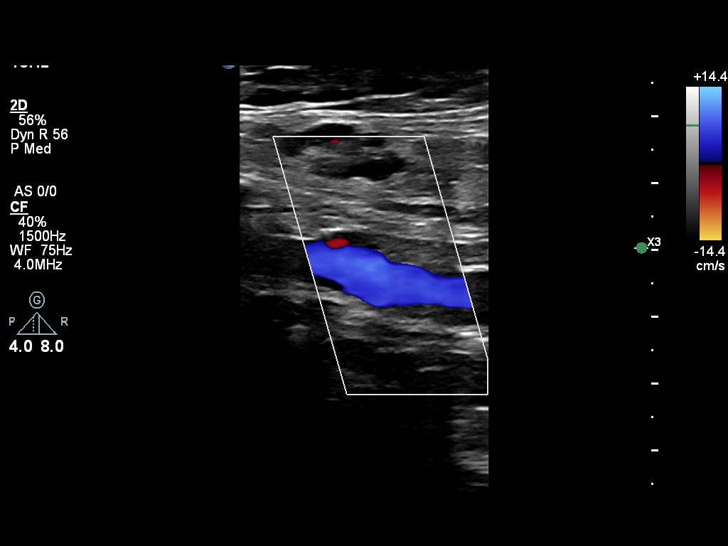
[im 39/49]
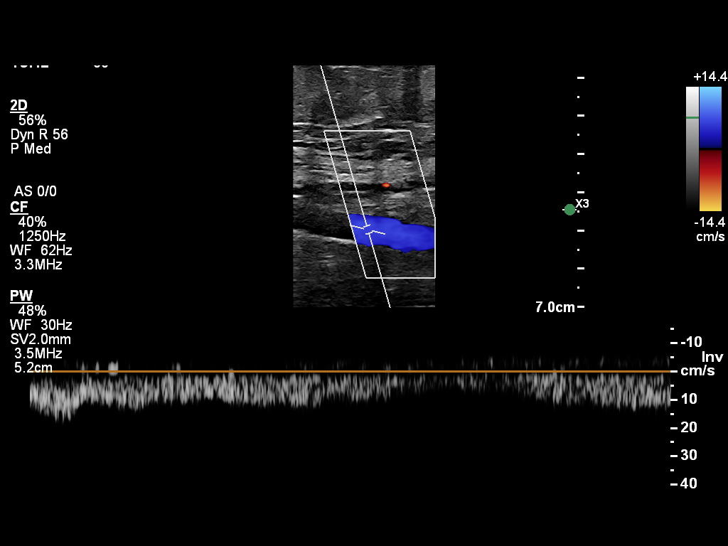
[im 45/49]
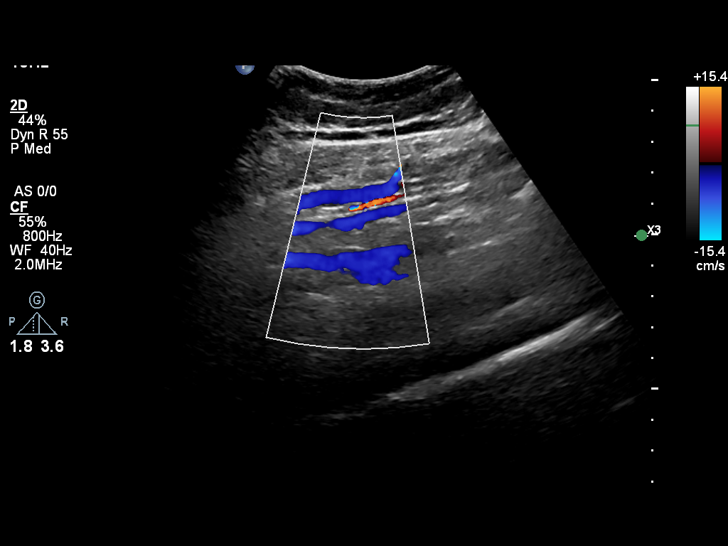
[im 49/49]
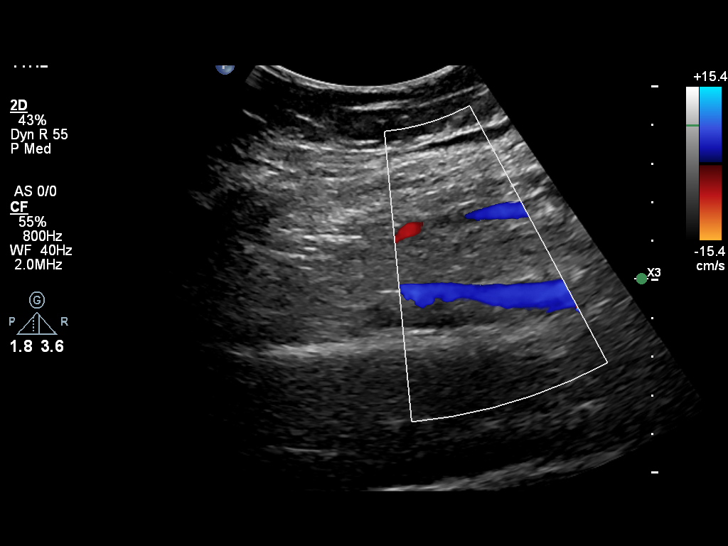

[13 of 16 positions shown; findings below may reference images not displayed]

FINDINGS: Left Lower Extremity:
Common femoral vein: Compressible without thrombus. Color and spectral Doppler
appear within normal limits.
Femoral vein: Compressible without thrombus. Color and spectral Doppler appear
within normal limits.
Popliteal vein: Compressible without thrombus. Color and spectral Doppler appear
within normal limits.
The visualized central greater saphenous and deep femoral veins at their
junction with the common femoral vein also appear patent.
Visualized below knee veins are patent.
Right Lower Extremity:
Common femoral vein: Compressible without thrombus. Color and spectral Doppler
appear within normal limits.
Femoral vein: Compressible without thrombus. Color and spectral Doppler appear
within normal limits.
Popliteal vein: Compressible without thrombus. Color and spectral Doppler appear
within normal limits.
The visualized central greater saphenous and deep femoral veins at their
junction with the common femoral vein also appear patent.
Visualized below knee veins are patent.
IMPRESSION: No evidence of Right or Left lower extremity deep venous thrombosis.
If concern for acute deep vein thrombosis persists, consider follow-up
evaluation in 5-7 days.
[DATE]
Is the patient pregnant?
No
